# Patient Record
Sex: Female | Born: 1990 | Race: Black or African American | Hispanic: No | Marital: Single | Smoking: Current some day smoker
Health system: Southern US, Community
[De-identification: ages and names within clinical notes are randomized; demographics above are authoritative.]

## PROBLEM LIST (undated history)

## (undated) DIAGNOSIS — N926 Irregular menstruation, unspecified: Secondary | ICD-10-CM

## (undated) DIAGNOSIS — J45909 Unspecified asthma, uncomplicated: Secondary | ICD-10-CM

---

## 2013-06-01 ENCOUNTER — Emergency Department: Payer: Self-pay | Admitting: Emergency Medicine

## 2013-06-01 LAB — URINALYSIS, COMPLETE
Bacteria: NONE SEEN
Bilirubin,UR: NEGATIVE
Blood: NEGATIVE
Glucose,UR: NEGATIVE mg/dL (ref 0–75)
Ketone: NEGATIVE
Leukocyte Esterase: NEGATIVE
Nitrite: NEGATIVE
Protein: NEGATIVE
RBC,UR: 1 /HPF (ref 0–5)
Specific Gravity: 1.02 (ref 1.003–1.030)
Squamous Epithelial: 1
WBC UR: 5 /HPF (ref 0–5)

## 2013-06-01 LAB — WET PREP, GENITAL

## 2014-04-04 ENCOUNTER — Emergency Department: Payer: Self-pay | Admitting: Emergency Medicine

## 2014-05-06 ENCOUNTER — Emergency Department: Payer: Self-pay | Admitting: Emergency Medicine

## 2014-08-06 ENCOUNTER — Emergency Department: Payer: Self-pay | Admitting: Internal Medicine

## 2014-09-21 ENCOUNTER — Emergency Department: Payer: Self-pay | Admitting: Student

## 2015-04-22 ENCOUNTER — Encounter: Payer: Self-pay | Admitting: Emergency Medicine

## 2015-04-22 ENCOUNTER — Emergency Department
Admission: EM | Admit: 2015-04-22 | Discharge: 2015-04-22 | Disposition: A | Discharge status: Home / Self Care | Payer: Self-pay | Attending: Emergency Medicine | Admitting: Emergency Medicine

## 2015-04-22 ENCOUNTER — Emergency Department: Payer: Self-pay

## 2015-04-22 DIAGNOSIS — J209 Acute bronchitis, unspecified: Secondary | ICD-10-CM

## 2015-04-22 DIAGNOSIS — J45909 Unspecified asthma, uncomplicated: Secondary | ICD-10-CM | POA: Insufficient documentation

## 2015-04-22 DIAGNOSIS — Z720 Tobacco use: Secondary | ICD-10-CM | POA: Insufficient documentation

## 2015-04-22 DIAGNOSIS — J45901 Unspecified asthma with (acute) exacerbation: Secondary | ICD-10-CM | POA: Insufficient documentation

## 2015-04-22 DIAGNOSIS — R51 Headache: Secondary | ICD-10-CM | POA: Insufficient documentation

## 2015-04-22 MED ORDER — ALBUTEROL SULFATE HFA 108 (90 BASE) MCG/ACT IN AERS: 2.0000 | INHALATION_SPRAY | Freq: Four times a day (QID) | RESPIRATORY_TRACT | Status: DC | PRN

## 2015-04-22 MED ORDER — PREDNISONE 10 MG PO TABS: 50.0000 mg | ORAL_TABLET | Freq: Every day | ORAL | Status: DC

## 2015-04-22 MED ORDER — AZITHROMYCIN 250 MG PO TABS: ORAL_TABLET | ORAL | Status: DC

## 2015-04-22 NOTE — ED Notes (Signed)
Pt presents with multiple medical complaints of congestion, and chest pain intermittently. Pt states chest pain is worse when she is lifting stuff, but she has pain when she is just sitting. Pt states she has intermittent cough as well.

## 2015-04-22 NOTE — ED Notes (Signed)
Pt ambulatory to triage; states "I'm just feeling sick, I don't know if I have a sinus infection or bronchitis". Pt reports cough, chest congestion, headache, runny nose sinus pressure. Pt reports she is trying to quit smoking.

## 2015-04-22 NOTE — ED Provider Notes (Signed)
[facility] Emergency Department Provider Note  ____________________________________________  Time seen: Approximately 4:33 PM  I have reviewed the triage vital signs and the nursing notes.   HISTORY  Chief Complaint Cough    HPI Kathy Greene is a 24 y.o. female presents to the emergency department for a 7 day history of cough and congestion. She also reports that she has had some rhinorrhea, sore throat, bilateral ear pain. She tells me she has pain in her chest with coughing and taking a deep breath.She's had no relief with over-the-counter cold medications. She states that she switched her brand of cigarettes just prior to onset of symptoms.   Past Medical History  Diagnosis Date  . Arthritis     Patient Active Problem List   Diagnosis Date Noted  . Asthma 04/22/2015    History reviewed. No pertinent past surgical history.  Current Outpatient Rx  Name  Route  Sig  Dispense  Refill  . albuterol (PROVENTIL HFA;VENTOLIN HFA) 108 (90 BASE) MCG/ACT inhaler   Inhalation   Inhale 2 puffs into the lungs every 6 (six) hours as needed for wheezing or shortness of breath.   1 Inhaler   2   . azithromycin (ZITHROMAX Z-PAK) 250 MG tablet      Take 2 tablets (500 mg) on  Day 1,  followed by 1 tablet (250 mg) once daily on Days 2 through 5.   6 each   0   . predniSONE (DELTASONE) 10 MG tablet   Oral   Take 5 tablets (50 mg total) by mouth daily.   25 tablet   0     Allergies Sulfa antibiotics  No family history on file.  Social History History  Substance Use Topics  . Smoking status: Current Every Day Smoker  . Smokeless tobacco: Not on file  . Alcohol Use: No    Review of Systems Constitutional: Subjective fever 2 days ago. Eyes: No visual changes. ENT:  sore throat. Cardiovascular: Intermittent chest pain with cough and deep breath Respiratory: Denies shortness of breath. Gastrointestinal: No abdominal pain.  No nausea, no  vomiting.  No diarrhea.  No constipation. Genitourinary: Negative for dysuria. Musculoskeletal: Negative for back pain. Skin: Negative for rash. Neurological: Generalized headache without associated weakness.  10-point ROS otherwise negative.  ____________________________________________   PHYSICAL EXAM:  VITAL SIGNS: ED Triage Vitals  Enc Vitals Group     BP 04/22/15 1540 116/69 mmHg     Pulse Rate 04/22/15 1540 71     Resp 04/22/15 1540 19     Temp 04/22/15 1540 98.1 F (36.7 C)     Temp Source 04/22/15 1540 Oral     SpO2 04/22/15 1540 97 %     Weight 04/22/15 1540 200 lb (90.719 kg)     Height 04/22/15 1540 5\' 3"  (1.6 m)     Head Cir --      Peak Flow --      Pain Score 04/22/15 1540 0     Pain Loc --      Pain Edu? --      Excl. in GC? --     Constitutional: Alert and oriented. Well appearing and in no acute distress. Eyes: Conjunctivae are normal. PERRL. EOMI. Head: Atraumatic. Nose: No congestion/rhinnorhea. Mouth/Throat: Mucous membranes are moist.  Oropharynx mildly erythematous. Neck: No stridor.   Hematological/Lymphatic/Immunilogical: No cervical lymphadenopathy. Cardiovascular: Normal rate, regular rhythm. Grossly normal heart sounds.  Good peripheral circulation. Respiratory: Normal respiratory effort.  No retractions.  Faint expiratory wheeze in bilateral bases. Gastrointestinal: Soft and nontender. No distention. No abdominal bruits. No CVA tenderness. Musculoskeletal: No lower extremity tenderness nor edema.  No joint effusions. Neurologic:  Normal speech and language. No gross focal neurologic deficits are appreciated. Speech is normal. No gait instability. Skin:  Skin is warm, dry and intact. No rash noted. Psychiatric: Mood and affect are normal. Speech and behavior are normal.  ____________________________________________   LABS (all labs ordered are listed, but only abnormal results are displayed)  Labs Reviewed - No data to  display ____________________________________________  EKG  Not indicated ____________________________________________  RADIOLOGY  No infiltrate or acute illness. ____________________________________________   PROCEDURES  Procedure(s) performed: None  Critical Care performed: No  ____________________________________________   INITIAL IMPRESSION / ASSESSMENT AND PLAN / ED COURSE  Pertinent labs & imaging results that were available during my care of the patient were reviewed by me and considered in my medical decision making (see chart for details).  Initial impression: Upper respiratory tract infection. Due to subjective fever and length of cough with intermittent chest pain I will do a chest x-ray. ____________________________________________   FINAL CLINICAL IMPRESSION(S) / ED DIAGNOSES  Final diagnoses:  Acute bronchitis, unspecified organism      Chinita Pester, FNP 04/22/15 1746  Minna Antis, MD 04/22/15 2353

## 2015-04-22 NOTE — Discharge Instructions (Signed)

## 2015-06-23 ENCOUNTER — Emergency Department: Payer: BLUE CROSS/BLUE SHIELD

## 2015-06-23 ENCOUNTER — Emergency Department
Admission: EM | Admit: 2015-06-23 | Discharge: 2015-06-23 | Disposition: A | Discharge status: Home / Self Care | Payer: BLUE CROSS/BLUE SHIELD | Attending: Emergency Medicine | Admitting: Emergency Medicine

## 2015-06-23 ENCOUNTER — Encounter: Payer: Self-pay | Admitting: Emergency Medicine

## 2015-06-23 DIAGNOSIS — Z7952 Long term (current) use of systemic steroids: Secondary | ICD-10-CM | POA: Diagnosis not present

## 2015-06-23 DIAGNOSIS — Z720 Tobacco use: Secondary | ICD-10-CM | POA: Diagnosis not present

## 2015-06-23 DIAGNOSIS — J45909 Unspecified asthma, uncomplicated: Secondary | ICD-10-CM

## 2015-06-23 DIAGNOSIS — J069 Acute upper respiratory infection, unspecified: Secondary | ICD-10-CM | POA: Diagnosis not present

## 2015-06-23 DIAGNOSIS — Z79899 Other long term (current) drug therapy: Secondary | ICD-10-CM | POA: Diagnosis not present

## 2015-06-23 DIAGNOSIS — F17200 Nicotine dependence, unspecified, uncomplicated: Secondary | ICD-10-CM

## 2015-06-23 DIAGNOSIS — R05 Cough: Secondary | ICD-10-CM | POA: Diagnosis present

## 2015-06-23 HISTORY — DX: Unspecified asthma, uncomplicated: J45.909

## 2015-06-23 MED ORDER — BENZONATATE 100 MG PO CAPS: ORAL_CAPSULE | ORAL | Status: DC

## 2015-06-23 NOTE — Discharge Instructions (Signed)
FOLLOW UP WITH [facility] IF ANY CONTINUED PROBLEMS  STOP SMOKING

## 2015-06-23 NOTE — ED Notes (Signed)
States finished antibiotic for same a few days ago.

## 2015-06-23 NOTE — ED Notes (Signed)
AAOx3.  No SOB/DOE.  Reports history of Asthma.  Has used Albuterol inhaler infrequently, but states after using it, patient feels better.

## 2015-06-23 NOTE — ED Provider Notes (Signed)
[facility] Emergency Department Provider Note  ____________________________________________  Time seen: Approximately 1:56 PM  I have reviewed the triage vital signs and the nursing notes.   HISTORY  Chief Complaint Cough   HPI Kathy Greene is a 24 y.o. female patient is here with complaint of cough. She states that she was seen at [facility] last week where she was given an albuterol inhaler. Patient does have a history of asthma and uses an inhaler infrequently. She is continue to smoke 6 cigarettes per day. She denies any fever, headache or nausea or vomiting. Cough is mostly nonproductive. Pain scale at this time is 0/0   Past Medical History  Diagnosis Date  . Asthma     Patient Active Problem List   Diagnosis Date Noted  . Asthma 04/22/2015    History reviewed. No pertinent past surgical history.  Current Outpatient Rx  Name  Route  Sig  Dispense  Refill  . albuterol (PROVENTIL HFA;VENTOLIN HFA) 108 (90 BASE) MCG/ACT inhaler   Inhalation   Inhale 2 puffs into the lungs every 6 (six) hours as needed for wheezing or shortness of breath.   1 Inhaler   2   . azithromycin (ZITHROMAX Z-PAK) 250 MG tablet      Take 2 tablets (500 mg) on  Day 1,  followed by 1 tablet (250 mg) once daily on Days 2 through 5.   6 each   0   . benzonatate (TESSALON PERLES) 100 MG capsule      1-2 perles every 8 hours prn cough   30 capsule   0   . predniSONE (DELTASONE) 10 MG tablet   Oral   Take 5 tablets (50 mg total) by mouth daily.   25 tablet   0     Allergies Sulfa antibiotics  History reviewed. No pertinent family history.  Social History History  Substance Use Topics  . Smoking status: Current Every Day Smoker    Types: Cigarettes  . Smokeless tobacco: Not on file  . Alcohol Use: No    Review of Systems Constitutional: No fever/chills Eyes: No visual changes. ENT: No sore throat. Cardiovascular: Denies chest  pain. Respiratory: Denies shortness of breath. Productive cough Gastrointestinal: No abdominal pain.  No nausea, no vomiting.  No diarrhea.  No constipation. Genitourinary: Negative for dysuria. Musculoskeletal: Negative for back pain. Skin: Negative for rash. Neurological: Negative for headaches, focal weakness or numbness.  10-point ROS otherwise negative.  ____________________________________________   PHYSICAL EXAM:  VITAL SIGNS: ED Triage Vitals  Enc Vitals Group     BP 06/23/15 1201 120/68 mmHg     Pulse Rate 06/23/15 1201 83     Resp --      Temp 06/23/15 1201 98.2 F (36.8 C)     Temp src --      SpO2 06/23/15 1201 97 %     Weight 06/23/15 1201 207 lb (93.895 kg)     Height 06/23/15 1201  (1.6 m)     Head Cir --      Peak Flow --      Pain Score 06/23/15 1202 0     Pain Loc --      Pain Edu? --      Excl. in GC? --     Constitutional: Alert and oriented. Well appearing and in no acute distress. Eyes: Conjunctivae are normal. PERRL. EOMI. Head: Atraumatic. Nose: No congestion/rhinnorhea. Neck: No stridor.   Cardiovascular: Normal rate, regular rhythm. Grossly  normal heart sounds.  Good peripheral circulation. Respiratory: Normal respiratory effort.  No retractions. Lungs CTAB. Gastrointestinal: Soft and nontender. No distention.  Musculoskeletal: No lower extremity tenderness nor edema.  No joint effusions. Neurologic:  Normal speech and language. No gross focal neurologic deficits are appreciated. No gait instability. Skin:  Skin is warm, dry and intact. No rash noted. Psychiatric: Mood and affect are normal. Speech and behavior are normal.  ____________________________________________   LABS (all labs ordered are listed, but only abnormal results are displayed)  Labs Reviewed - No data to display ____________________________________________  RADIOLOGY  Chest x-ray per radiologist and reviewed by me showed no active cardiopulmonary disease I,  Tommi Rumps, personally viewed and evaluated these images as part of my medical decision making.  ____________________________________________   PROCEDURES  Procedure(s) performed: None  Critical Care performed: No  ____________________________________________   INITIAL IMPRESSION / ASSESSMENT AND PLAN / ED COURSE  Pertinent labs & imaging results that were available during my care of the patient were reviewed by me and considered in my medical decision making (see chart for details).  Patient was reassured that the Augmentin that she took from [facility] is sufficient she was started on Tessalon Perles for cough. She is encouraged to stop smoking and she will continue using albuterol inhaler as needed. ____________________________________________   FINAL CLINICAL IMPRESSION(S) / ED DIAGNOSES  Final diagnoses:  URI (upper respiratory infection)  Asthma in adult, unspecified asthma severity, uncomplicated  Current smoker      Tommi Rumps, PA-C 06/23/15 1544  Arnaldo Natal, MD 06/23/15 1637  Arnaldo Natal, MD 06/23/15 [phone]

## 2015-06-23 NOTE — ED Notes (Signed)
Pt alert and oriented X4, active, cooperative, pt in NAD. RR even and unlabored, color WNL.  Pt informed to return if any life threatening symptoms occur.   

## 2015-07-17 ENCOUNTER — Encounter: Payer: Self-pay | Admitting: Emergency Medicine

## 2015-07-17 ENCOUNTER — Emergency Department
Admission: EM | Admit: 2015-07-17 | Discharge: 2015-07-17 | Disposition: A | Discharge status: Home / Self Care | Payer: BLUE CROSS/BLUE SHIELD | Attending: Emergency Medicine | Admitting: Emergency Medicine

## 2015-07-17 DIAGNOSIS — Z720 Tobacco use: Secondary | ICD-10-CM | POA: Insufficient documentation

## 2015-07-17 DIAGNOSIS — Z7952 Long term (current) use of systemic steroids: Secondary | ICD-10-CM | POA: Insufficient documentation

## 2015-07-17 DIAGNOSIS — N939 Abnormal uterine and vaginal bleeding, unspecified: Secondary | ICD-10-CM | POA: Diagnosis present

## 2015-07-17 DIAGNOSIS — Z3202 Encounter for pregnancy test, result negative: Secondary | ICD-10-CM | POA: Insufficient documentation

## 2015-07-17 DIAGNOSIS — N938 Other specified abnormal uterine and vaginal bleeding: Secondary | ICD-10-CM | POA: Diagnosis not present

## 2015-07-17 LAB — COMPREHENSIVE METABOLIC PANEL
ALT: 16 U/L (ref 14–54)
ANION GAP: 7 (ref 5–15)
AST: 22 U/L (ref 15–41)
Albumin: 3.6 g/dL (ref 3.5–5.0)
Alkaline Phosphatase: 52 U/L (ref 38–126)
BUN: 8 mg/dL (ref 6–20)
CHLORIDE: 109 mmol/L (ref 101–111)
CO2: 26 mmol/L (ref 22–32)
Calcium: 9.5 mg/dL (ref 8.9–10.3)
Creatinine, Ser: 0.91 mg/dL (ref 0.44–1.00)
GFR calc non Af Amer: >60 mL/min (ref >60)
Glucose, Bld: 95 mg/dL (ref 65–99)
Potassium: 4.2 mmol/L (ref 3.5–5.1)
SODIUM: 142 mmol/L (ref 135–145)
Total Bilirubin: 0.3 mg/dL (ref 0.3–1.2)
Total Protein: 7.1 g/dL (ref 6.5–8.1)

## 2015-07-17 LAB — CBC
HCT: 36.8 % (ref 35.0–47.0)
Hemoglobin: 11.5 g/dL — ABNORMAL LOW (ref 12.0–16.0)
MCH: 26.7 pg (ref 26.0–34.0)
MCHC: 31.1 g/dL — AB (ref 32.0–36.0)
MCV: 85.8 fL (ref 80.0–100.0)
PLATELETS: 257 10*3/uL (ref 150–440)
RBC: 4.29 MIL/uL (ref 3.80–5.20)
RDW: 16.1 % — ABNORMAL HIGH (ref 11.5–14.5)
WBC: 5.2 10*3/uL (ref 3.6–11.0)

## 2015-07-17 LAB — URINALYSIS COMPLETE WITH MICROSCOPIC (ARMC ONLY)
BACTERIA UA: NONE SEEN
Bilirubin Urine: NEGATIVE
Glucose, UA: NEGATIVE mg/dL
Ketones, ur: NEGATIVE mg/dL
LEUKOCYTES UA: NEGATIVE
NITRITE: NEGATIVE
PROTEIN: NEGATIVE mg/dL
SPECIFIC GRAVITY, URINE: 1.015 (ref 1.005–1.030)
pH: 5 (ref 5.0–8.0)

## 2015-07-17 LAB — WET PREP, GENITAL
CLUE CELLS WET PREP: NONE SEEN
TRICH WET PREP: NONE SEEN
WBC, Wet Prep HPF POC: NONE SEEN
Yeast Wet Prep HPF POC: NONE SEEN

## 2015-07-17 LAB — CHLAMYDIA/NGC RT PCR (ARMC ONLY)
CHLAMYDIA TR: NOT DETECTED
N gonorrhoeae: NOT DETECTED

## 2015-07-17 LAB — PREGNANCY, URINE: PREG TEST UR: NEGATIVE

## 2015-07-17 MED ORDER — TRAMADOL HCL 50 MG PO TABS: 50.0000 mg | ORAL_TABLET | Freq: Four times a day (QID) | ORAL | Status: DC | PRN

## 2015-07-17 NOTE — ED Notes (Signed)
Pt to ed with c/o vaginal bleeding that started on Sat.  Pt states she has not had a period in several months due to depo and then started on BCPs.  Pt states period is heavy with cramping and nausea.

## 2015-07-17 NOTE — Discharge Instructions (Signed)

## 2015-07-17 NOTE — ED Provider Notes (Signed)
[facility] Emergency Department Provider Note  Time seen: 2:54 PM  I have reviewed the triage vital signs and the nursing notes.   HISTORY  Chief Complaint Vaginal Bleeding    HPI Kathy Greene is a 24 y.o. female with a past medical history of asthma who presents the emergency department 2 days of lower abdominal cramping and vaginal bleeding. According to the patient she has had very irregular periods. She was on Depo-Provera until January. Now she states she is on birth control pills. States she has not had regular periods for greater than 1 year. She presents the emergency department today for heavy vaginal bleeding 2 days, with intermittent abdominal cramping. Patient states the cramping is somewhat worse than her normal cramping with her periods, but states she has not had a period for quite a while. Describes the cramping as moderate, lower abdomen, no modifying factors identified. Some nausea but denies vomiting, diarrhea, dysuria.     Past Medical History  Diagnosis Date  . Asthma     Patient Active Problem List   Diagnosis Date Noted  . Asthma 04/22/2015    History reviewed. No pertinent past surgical history.  Current Outpatient Rx  Name  Route  Sig  Dispense  Refill  . albuterol (PROVENTIL HFA;VENTOLIN HFA) 108 (90 BASE) MCG/ACT inhaler   Inhalation   Inhale 2 puffs into the lungs every 6 (six) hours as needed for wheezing or shortness of breath.   1 Inhaler   2   . azithromycin (ZITHROMAX Z-PAK) 250 MG tablet      Take 2 tablets (500 mg) on  Day 1,  followed by 1 tablet (250 mg) once daily on Days 2 through 5.   6 each   0   . benzonatate (TESSALON PERLES) 100 MG capsule      1-2 perles every 8 hours prn cough   30 capsule   0   . predniSONE (DELTASONE) 10 MG tablet   Oral   Take 5 tablets (50 mg total) by mouth daily.   25 tablet   0     Allergies Sulfa antibiotics  History reviewed. No pertinent family  history.  Social History Social History  Substance Use Topics  . Smoking status: Current Every Day Smoker    Types: Cigarettes  . Smokeless tobacco: None  . Alcohol Use: No    Review of Systems Constitutional: Negative for fever. Cardiovascular: Negative for chest pain. Respiratory: Negative for shortness of breath. Gastrointestinal: Positive for lower abdominal cramping. Genitourinary: Negative for dysuria. Positive for vaginal bleeding. Musculoskeletal: Negative for back pain. Neurological: Negative for headache 10-point ROS otherwise negative.  ____________________________________________   PHYSICAL EXAM:  VITAL SIGNS: ED Triage Vitals  Enc Vitals Group     BP 07/17/15 1322 113/65 mmHg     Pulse Rate 07/17/15 1322 83     Resp 07/17/15 1322 20     Temp 07/17/15 1322 98.5 F (36.9 C)     Temp Source 07/17/15 1322 Oral     SpO2 07/17/15 1322 97 %     Weight 07/17/15 1322 200 lb (90.719 kg)     Height 07/17/15 1322 5\' 3"  (1.6 m)     Head Cir --      Peak Flow --      Pain Score 07/17/15 1323 10     Pain Loc --      Pain Edu? --      Excl. in GC? --  Constitutional: Alert and oriented. Well appearing and in no distress. Eyes: Normal exam ENT   Mouth/Throat: Mucous membranes are moist. Cardiovascular: Normal rate, regular rhythm. No murmurs, rubs, or gallops. Respiratory: Normal respiratory effort without tachypnea nor retractions. Breath sounds are clear and equal bilaterally. No wheezes/rales/rhonchi. Gastrointestinal: Soft, mild suprapubic tenderness palpation. No rebound or guarding. No CVA tenderness palpation. Genitourinary: Mild bleeding from the cervical os on exam. No cervical motion tenderness or adnexal tenderness on exam. Musculoskeletal: Nontender with normal range of motion in all extremities.  Neurologic:  Normal speech and language. No gross focal neurologic deficits  Skin:  Skin is warm, dry and intact.  Psychiatric: Mood and affect are  normal. Speech and behavior are normal.   ____________________________________________    INITIAL IMPRESSION / ASSESSMENT AND PLAN / ED COURSE  Pertinent labs & imaging results that were available during my care of the patient were reviewed by me and considered in my medical decision making (see chart for details).  Patient with 2 days of vaginal bleeding and lower abdominal cramping. We'll check labs, and perform a pelvic exam. Patient appears very well, no acute distress. Mild lower abdominal tenderness palpation on exam.  Labs are within normal limits. Mild vaginal bleeding on exam. We'll discharge patient home with OB/GYN follow-up.  ____________________________________________   FINAL CLINICAL IMPRESSION(S) / ED DIAGNOSES  Abdominal cramping Vaginal bleeding   Minna Antis, MD 07/17/15 [phone]

## 2015-12-26 ENCOUNTER — Emergency Department
Admission: EM | Admit: 2015-12-26 | Discharge: 2015-12-26 | Disposition: A | Discharge status: Home / Self Care | Payer: BLUE CROSS/BLUE SHIELD | Attending: Emergency Medicine | Admitting: Emergency Medicine

## 2015-12-26 ENCOUNTER — Encounter: Payer: Self-pay | Admitting: *Deleted

## 2015-12-26 ENCOUNTER — Emergency Department: Payer: BLUE CROSS/BLUE SHIELD

## 2015-12-26 DIAGNOSIS — N938 Other specified abnormal uterine and vaginal bleeding: Secondary | ICD-10-CM | POA: Insufficient documentation

## 2015-12-26 DIAGNOSIS — Z7952 Long term (current) use of systemic steroids: Secondary | ICD-10-CM | POA: Diagnosis not present

## 2015-12-26 DIAGNOSIS — Z79899 Other long term (current) drug therapy: Secondary | ICD-10-CM | POA: Diagnosis not present

## 2015-12-26 DIAGNOSIS — N920 Excessive and frequent menstruation with regular cycle: Secondary | ICD-10-CM

## 2015-12-26 DIAGNOSIS — F17210 Nicotine dependence, cigarettes, uncomplicated: Secondary | ICD-10-CM | POA: Insufficient documentation

## 2015-12-26 DIAGNOSIS — N939 Abnormal uterine and vaginal bleeding, unspecified: Secondary | ICD-10-CM | POA: Diagnosis present

## 2015-12-26 LAB — URINALYSIS COMPLETE WITH MICROSCOPIC (ARMC ONLY)
BILIRUBIN URINE: NEGATIVE
Bacteria, UA: NONE SEEN
GLUCOSE, UA: NEGATIVE mg/dL
KETONES UR: NEGATIVE mg/dL
LEUKOCYTES UA: NEGATIVE
NITRITE: NEGATIVE
PH: 7 (ref 5.0–8.0)
Protein, ur: 100 mg/dL — AB
Specific Gravity, Urine: 1.027 (ref 1.005–1.030)

## 2015-12-26 LAB — CBC WITH DIFFERENTIAL/PLATELET
BASOS ABS: 0.1 10*3/uL (ref 0–0.1)
BASOS PCT: 1 %
EOS ABS: 0.1 10*3/uL (ref 0–0.7)
EOS PCT: 2 %
HCT: 35 % (ref 35.0–47.0)
Hemoglobin: 11.5 g/dL — ABNORMAL LOW (ref 12.0–16.0)
LYMPHS PCT: 43 %
Lymphs Abs: 2.5 10*3/uL (ref 1.0–3.6)
MCH: 29.2 pg (ref 26.0–34.0)
MCHC: 32.7 g/dL (ref 32.0–36.0)
MCV: 89.2 fL (ref 80.0–100.0)
Monocytes Absolute: 0.4 10*3/uL (ref 0.2–0.9)
Monocytes Relative: 7 %
Neutro Abs: 2.7 10*3/uL (ref 1.4–6.5)
Neutrophils Relative %: 47 %
PLATELETS: 262 10*3/uL (ref 150–440)
RBC: 3.93 MIL/uL (ref 3.80–5.20)
RDW: 15.7 % — ABNORMAL HIGH (ref 11.5–14.5)
WBC: 5.7 10*3/uL (ref 3.6–11.0)

## 2015-12-26 LAB — BASIC METABOLIC PANEL
Anion gap: 8 (ref 5–15)
BUN: 12 mg/dL (ref 6–20)
CALCIUM: 8.9 mg/dL (ref 8.9–10.3)
CO2: 25 mmol/L (ref 22–32)
CREATININE: 0.73 mg/dL (ref 0.44–1.00)
Chloride: 107 mmol/L (ref 101–111)
GFR calc Af Amer: >60 mL/min (ref >60)
Glucose, Bld: 108 mg/dL — ABNORMAL HIGH (ref 65–99)
POTASSIUM: 3.8 mmol/L (ref 3.5–5.1)
SODIUM: 140 mmol/L (ref 135–145)

## 2015-12-26 NOTE — ED Notes (Signed)
Pt reports she went off of depo shot 1 year ago, regularity to periods began 4 months ago, lasting anywhere from 3-5 days. Pt states she began to bleed on January 7 and continues today. Pt reports vaginal camping. Pt also reports [facility] for STD testing, negative on all.

## 2015-12-26 NOTE — ED Notes (Signed)
Pt reports she has been bleeding for 3 weeks.  Tonight abd cramping became worse.  No dysuria.  Pt has lower back pain.

## 2015-12-26 NOTE — Discharge Instructions (Signed)

## 2015-12-26 NOTE — ED Provider Notes (Signed)
[facility] Emergency Department Provider Note  ____________________________________________  Time seen: 3:50 AM  I have reviewed the triage vital signs and the nursing notes.   HISTORY  Chief Complaint Abdominal Cramping      HPI Kathy Greene is a 25 y.o. female presents with vaginal bleeding 3 weeks. Patient states that she is using approximately 4-5 pads per day for the past 3 weeks. Patient admits to very irregular menses since discontinuing the pole one year ago. In addition the patient states that [facility] ago were STD testings were done which were all negative.     Past Medical History  Diagnosis Date  . Asthma     Patient Active Problem List   Diagnosis Date Noted  . Asthma 04/22/2015    No past surgical history on file.  Current Outpatient Rx  Name  Route  Sig  Dispense  Refill  . albuterol (PROVENTIL HFA;VENTOLIN HFA) 108 (90 BASE) MCG/ACT inhaler   Inhalation   Inhale 2 puffs into the lungs every 6 (six) hours as needed for wheezing or shortness of breath.   1 Inhaler   2   . azithromycin (ZITHROMAX Z-PAK) 250 MG tablet      Take 2 tablets (500 mg) on  Day 1,  followed by 1 tablet (250 mg) once daily on Days 2 through 5.   6 each   0   . benzonatate (TESSALON PERLES) 100 MG capsule      1-2 perles every 8 hours prn cough   30 capsule   0   . predniSONE (DELTASONE) 10 MG tablet   Oral   Take 5 tablets (50 mg total) by mouth daily.   25 tablet   0   . traMADol (ULTRAM) 50 MG tablet   Oral   Take 1 tablet (50 mg total) by mouth every 6 (six) hours as needed.   20 tablet   0     Allergies Sulfa antibiotics  No family history on file.  Social History Social History  Substance Use Topics  . Smoking status: Current Every Day Smoker    Types: Cigarettes  . Smokeless tobacco: None  . Alcohol Use: No    Review of Systems  Constitutional: Negative for  fever. Eyes: Negative for visual changes. ENT: Negative for sore throat. Cardiovascular: Negative for chest pain. Respiratory: Negative for shortness of breath. Gastrointestinal: Negative for abdominal pain, vomiting and diarrhea. Genitourinary: Negative for dysuria. Positive for vaginal bleeding Musculoskeletal: Negative for back pain. Skin: Negative for rash. Neurological: Negative for headaches, focal weakness or numbness.   10-point ROS otherwise negative.  ____________________________________________   PHYSICAL EXAM:  VITAL SIGNS: ED Triage Vitals  Enc Vitals Group     BP 12/26/15 0141 111/52 mmHg     Pulse Rate 12/26/15 0141 62     Resp 12/26/15 0141 18     Temp 12/26/15 0141 97.5 F (36.4 C)     Temp Source 12/26/15 0141 Oral     SpO2 12/26/15 0141 97 %     Weight 12/26/15 0141 225 lb (102.059 kg)     Height 12/26/15 0141  (1.6 m)     Head Cir --      Peak Flow --      Pain Score 12/26/15 0147 10     Pain Loc --      Pain Edu? --      Excl. in GC? --  Constitutional: Alert and oriented. Well appearing and in no distress. Eyes: Conjunctivae are normal. PERRL. Normal extraocular movements. ENT   Head: Normocephalic and atraumatic.   Nose: No congestion/rhinnorhea.   Mouth/Throat: Mucous membranes are moist.   Neck: No stridor. Hematological/Lymphatic/Immunilogical: No cervical lymphadenopathy. Cardiovascular: Normal rate, regular rhythm. Normal and symmetric distal pulses are present in all extremities. No murmurs, rubs, or gallops. Respiratory: Normal respiratory effort without tachypnea nor retractions. Breath sounds are clear and equal bilaterally. No wheezes/rales/rhonchi. Gastrointestinal: Soft and nontender. No distention. There is no CVA tenderness. Genitourinary: deferred Musculoskeletal: Nontender with normal range of motion in all extremities. No joint effusions.  No lower extremity tenderness nor edema. Neurologic:  Normal  speech and language. No gross focal neurologic deficits are appreciated. Speech is normal.  Skin:  Skin is warm, dry and intact. No rash noted. Psychiatric: Mood and affect are normal. Speech and behavior are normal. Patient exhibits appropriate insight and judgment.  ____________________________________________    LABS (pertinent positives/negatives)  Labs Reviewed  BASIC METABOLIC PANEL - Abnormal; Notable for the following:    Glucose, Bld 108 (*)    All other components within normal limits  CBC WITH DIFFERENTIAL/PLATELET - Abnormal; Notable for the following:    Hemoglobin 11.5 (*)    RDW 15.7 (*)    All other components within normal limits  URINALYSIS COMPLETEWITH MICROSCOPIC ([facility] ONLY) - Abnormal; Notable for the following:    Color, Urine YELLOW (*)    APPearance CLOUDY (*)    Hgb urine dipstick 3+ (*)    Protein, ur 100 (*)    Squamous Epithelial / LPF 0-5 (*)    All other components within normal limits       RADIOLOGY     US Pelvis Complete (Final result) Result time: 12/26/15 05:47:00   Final result by Rad Results In Interface (12/26/15 05:47:00)   Narrative:   CLINICAL DATA: Subacute onset of menorrhagia. Initial encounter.  EXAM: TRANSABDOMINAL AND TRANSVAGINAL ULTRASOUND OF PELVIS  TECHNIQUE: Both transabdominal and transvaginal ultrasound examinations of the pelvis were performed. Transabdominal technique was performed for global imaging of the pelvis including uterus, ovaries, adnexal regions, and pelvic cul-de-sac. It was necessary to proceed with endovaginal exam following the transabdominal exam to visualize the uterus and ovaries in greater detail.  COMPARISON: None  FINDINGS: Uterus  Measurements: 6.8 x 4.2 x 5.2 cm. No fibroids or other mass visualized. The uterus is retroverted in nature.  Endometrium  Thickness: 1.2 cm. A small amount of complex fluid is noted within the endometrial echo complex.  Right  ovary  Measurements: 4.2 x 3.3 x 3.0 cm. A 3.0 cm simple cyst is noted at the right ovary.  Left ovary  Measurements: 3.5 x 1.8 x 2.0 cm. Normal appearance/no adnexal mass.  Other findings  Trace free fluid is noted at the right adnexa.  IMPRESSION: 1. Small amount of complex fluid within the endometrial echo complex, reflecting blood. This likely corresponds to the patient's menorrhagia. 2. Small right ovarian cyst is likely physiologic.   Electronically Signed By: Roanna Raider M.D. On: 12/26/2015 05:47          US Transvaginal Non-OB (Final result) Result time: 12/26/15 05:47:00   Final result by Rad Results In Interface (12/26/15 05:47:00)   Narrative:   CLINICAL DATA: Subacute onset of menorrhagia. Initial encounter.  EXAM: TRANSABDOMINAL AND TRANSVAGINAL ULTRASOUND OF PELVIS  TECHNIQUE: Both transabdominal and transvaginal ultrasound examinations of the pelvis were performed. Transabdominal technique was performed for global imaging of  the pelvis including uterus, ovaries, adnexal regions, and pelvic cul-de-sac. It was necessary to proceed with endovaginal exam following the transabdominal exam to visualize the uterus and ovaries in greater detail.  COMPARISON: None  FINDINGS: Uterus  Measurements: 6.8 x 4.2 x 5.2 cm. No fibroids or other mass visualized. The uterus is retroverted in nature.  Endometrium  Thickness: 1.2 cm. A small amount of complex fluid is noted within the endometrial echo complex.  Right ovary  Measurements: 4.2 x 3.3 x 3.0 cm. A 3.0 cm simple cyst is noted at the right ovary.  Left ovary  Measurements: 3.5 x 1.8 x 2.0 cm. Normal appearance/no adnexal mass.  Other findings  Trace free fluid is noted at the right adnexa.  IMPRESSION: 1. Small amount of complex fluid within the endometrial echo complex, reflecting blood. This likely corresponds to the patient's menorrhagia. 2. Small right ovarian cyst is  likely physiologic.   Electronically Signed By: Roanna Raider M.D. On: 12/26/2015 05:47           INITIAL IMPRESSION / ASSESSMENT AND PLAN / ED COURSE  Pertinent labs & imaging results that were available during my care of the patient were reviewed by me and considered in my medical decision making (see chart for details).  History of physical exam consistent with dysfunctional uterine bleeding. Patient being referred to Dr. Dalbert [facility] on call for further evaluation and management on the outpatient setting  ____________________________________________   FINAL CLINICAL IMPRESSION(S) / ED DIAGNOSES  Final diagnoses:  Dysfunctional uterine bleeding      Darci Current, MD 12/27/15 [phone]

## 2016-03-06 ENCOUNTER — Emergency Department
Admission: EM | Admit: 2016-03-06 | Discharge: 2016-03-06 | Disposition: A | Discharge status: Home / Self Care | Payer: BLUE CROSS/BLUE SHIELD | Attending: Emergency Medicine | Admitting: Emergency Medicine

## 2016-03-06 ENCOUNTER — Encounter: Payer: Self-pay | Admitting: Emergency Medicine

## 2016-03-06 DIAGNOSIS — Z791 Long term (current) use of non-steroidal anti-inflammatories (NSAID): Secondary | ICD-10-CM | POA: Diagnosis not present

## 2016-03-06 DIAGNOSIS — N921 Excessive and frequent menstruation with irregular cycle: Secondary | ICD-10-CM | POA: Insufficient documentation

## 2016-03-06 DIAGNOSIS — J45909 Unspecified asthma, uncomplicated: Secondary | ICD-10-CM | POA: Diagnosis not present

## 2016-03-06 DIAGNOSIS — N938 Other specified abnormal uterine and vaginal bleeding: Secondary | ICD-10-CM | POA: Diagnosis not present

## 2016-03-06 DIAGNOSIS — Z79899 Other long term (current) drug therapy: Secondary | ICD-10-CM | POA: Insufficient documentation

## 2016-03-06 DIAGNOSIS — R109 Unspecified abdominal pain: Secondary | ICD-10-CM | POA: Diagnosis present

## 2016-03-06 DIAGNOSIS — F17210 Nicotine dependence, cigarettes, uncomplicated: Secondary | ICD-10-CM | POA: Insufficient documentation

## 2016-03-06 MED ORDER — DICYCLOMINE HCL 20 MG PO TABS: 20.0000 mg | ORAL_TABLET | Freq: Three times a day (TID) | ORAL | Status: DC | PRN

## 2016-03-06 MED ORDER — MEDROXYPROGESTERONE ACETATE 10 MG PO TABS: 10.0000 mg | ORAL_TABLET | Freq: Every day | ORAL | Status: DC

## 2016-03-06 NOTE — Discharge Instructions (Signed)
Dysfunctional Uterine Bleeding Dysfunctional uterine bleeding is abnormal bleeding from the uterus. Dysfunctional uterine bleeding includes:  A period that comes earlier or later than usual.  A period that is lighter, heavier, or has blood clots.  Bleeding between periods.  Skipping one or more periods.  Bleeding after sexual intercourse.  Bleeding after menopause. HOME CARE INSTRUCTIONS  Pay attention to any changes in your symptoms. Follow these instructions to help with your condition: Eating  Eat well-balanced meals. Include foods that are high in iron, such as liver, meat, shellfish, green leafy vegetables, and eggs.  If you become constipated:  Drink plenty of water.  Eat fruits and vegetables that are high in water and fiber, such as spinach, carrots, raspberries, apples, and mango. Medicines  Take over-the-counter and prescription medicines only as told by your health care provider.  Do not change medicines without talking with your health care provider.  Aspirin or medicines that contain aspirin may make the bleeding worse. Do not take those medicines:  During the week before your period.  During your period.  If you were prescribed iron pills, take them as told by your health care provider. Iron pills help to replace iron that your body loses because of this condition. Activity  If you need to change your sanitary pad or tampon more than one time every 2 hours:  Lie in bed with your feet raised (elevated).  Place a cold pack on your lower abdomen.  Rest as much as possible until the bleeding stops or slows down.  Do not try to lose weight until the bleeding has stopped and your blood iron level is back to normal. Other Instructions  For two months, write down:  When your period starts.  When your period ends.  When any abnormal bleeding occurs.  What problems you notice.  Keep all follow up visits as told by your health care provider. This is  important. SEEK MEDICAL CARE IF:  You get light-headed or weak.  You have nausea and vomiting.  You cannot eat or drink without vomiting.  You feel dizzy or have diarrhea while you are taking medicines.  You are taking birth control pills or hormones, and you want to change them or stop taking them. SEEK IMMEDIATE MEDICAL CARE IF:  You develop a fever or chills.  You need to change your sanitary pad or tampon more than one time per hour.  Your bleeding becomes heavier, or your flow contains clots more often.  You develop pain in your abdomen.  You lose consciousness.  You develop a rash.   This information is not intended to replace advice given to you by your health care provider. Make sure you discuss any questions you have with your health care provider.   Document Released: 11/14/2000 Document Revised: 08/08/2015 Document Reviewed: 02/12/2015 Elsevier Interactive Patient Education 2016 [employer]  Metrorrhagia Metrorrhagia is bleeding from the uterus that happens irregularly but often. The bleeding generally happens between menstrual periods. HOME CARE INSTRUCTIONS Pay attention to any changes in your symptoms. Follow these instructions to help with your condition: Eating  Eat well-balanced meals. Include foods that are high in iron, such as liver, meat, shellfish, green leafy vegetables, and eggs.  If you become constipated:  Drink plenty of water.  Eat fruits and vegetables that are high in water and fiber, such as spinach, carrots, raspberries, apples, and mango. Medicines  Take over-the-counter and prescription medicines only as told by your health care provider.  Do not change  medicines without talking with your health care provider.  Aspirin or medicines that contain aspirin may make the bleeding worse. Do not take those medicines:  During the week before your period.  During your period.  If you were prescribed iron pills, take them as told by  your health care provider. Iron pills help to replace iron that your body loses because of this condition. Activity  If you need to change your sanitary pad or tampon more than one time every 2 hours:  Lie in bed with your feet raised (elevated).  Place a cold pack on your lower abdomen.  Rest as much as possible until the bleeding stops or slows down.  Do not try to lose weight until the bleeding has stopped and your blood iron level is back to normal. Other Instructions  For two months, write down:  When your period starts.  When your period ends.  When any abnormal bleeding occurs.  What problems you notice.  Keep all follow-up visits as told by your health care provider. This is important. SEEK MEDICAL CARE IF:  You get light-headed or weak.  You have nausea and vomiting.  You cannot eat or drink without vomiting.  You feel dizzy or have diarrhea while you are taking medicine.  You are taking birth control pills or hormones, and you want to change them or stop taking them. SEEK IMMEDIATE MEDICAL CARE IF:  You develop a fever or chills.  You need to change your sanitary pad or tampon more than one time per hour.  Your bleeding becomesheavy.  Your flow contains clots.  You develop pain in your abdomen.  You lose consciousness.  You develop a rash.   This information is not intended to replace advice given to you by your health care provider. Make sure you discuss any questions you have with your health care provider.   Document Released: 11/17/2005 Document Revised: 08/08/2015 Document Reviewed: 02/12/2015 Elsevier Interactive Patient Education [employer]

## 2016-03-06 NOTE — ED Notes (Signed)
Pt presents with abd cramping and bleeding for several mths.

## 2016-03-06 NOTE — ED Provider Notes (Signed)
[facility] Emergency Department Provider Note  ____________________________________________  Time seen: 12:50 PM  I have reviewed the triage vital signs and the nursing notes.   HISTORY  Chief Complaint Abdominal Cramping    HPI Kathy Greene is a 25 y.o. female who complains of chronic pelvic cramping and vaginal bleeding. This is been going on for almost a year. She takes oral birth control pills to help regulate it but still she says that she is bleeding almost every day of the month. At alternates between lighter and heavier days. The last 2 days been light but today seemed heavy again. The cramping pain is sharp and nonradiating. Pretty constant aching with intermittent pains in severe pain.     Past Medical History  Diagnosis Date  . Asthma      Patient Active Problem List   Diagnosis Date Noted  . Asthma 04/22/2015     History reviewed. No pertinent past surgical history.   Current Outpatient Rx  Name  Route  Sig  Dispense  Refill  . CRYSELLE-28 0.3-30 MG-MCG tablet   Oral   Take 1 tablet by mouth daily.           Dispense as written.   . naproxen (NAPROSYN) 500 MG tablet   Oral   Take 500 mg by mouth 2 (two) times daily with a meal.         . dicyclomine (BENTYL) 20 MG tablet   Oral   Take 1 tablet (20 mg total) by mouth 3 (three) times daily as needed for spasms.   30 tablet   0   . medroxyPROGESTERone (PROVERA) 10 MG tablet   Oral   Take 1 tablet (10 mg total) by mouth daily.   10 tablet   0      Allergies Sulfa antibiotics   No family history on file.  Social History Social History  Substance Use Topics  . Smoking status: Current Every Day Smoker    Types: Cigarettes  . Smokeless tobacco: None  . Alcohol Use: No    Review of Systems  Constitutional:   No fever or chills. No weight changes Eyes:   No vision changes.  ENT:   No sore throat. No rhinorrhea. Cardiovascular:   No chest  pain. Respiratory:   No dyspnea or cough. Gastrointestinal:   Frequent suprapubic cramping pain. No vomiting or diarrhea. Normal oral intake.Marland Kitchen  No BRBPR or melena. Genitourinary:   Negative for dysuria or difficulty urinating. Musculoskeletal:   Negative for focal pain or swelling Skin:   Negative for rash. Neurological:   Negative for headaches, focal weakness or numbness. No dizziness  10-point ROS otherwise negative.  ____________________________________________   PHYSICAL EXAM:  VITAL SIGNS: ED Triage Vitals  Enc Vitals Group     BP 03/06/16 1222 124/81 mmHg     Pulse Rate 03/06/16 1222 70     Resp 03/06/16 1222 20     Temp 03/06/16 1222 98.1 F (36.7 C)     Temp Source 03/06/16 1222 Oral     SpO2 03/06/16 1222 98 %     Weight 03/06/16 1222 215 lb (97.523 kg)     Height 03/06/16 1222  (1.6 m)     Head Cir --      Peak Flow --      Pain Score 03/06/16 1222 10     Pain Loc --      Pain Edu? --      Excl. in GC? --  Vital signs reviewed, nursing assessments reviewed.   Constitutional:   Alert and oriented. Well appearing and in no distress. Eyes:   No scleral icterus. No conjunctival pallor. PERRL. EOMI ENT   Head:   Normocephalic and atraumatic.   Nose:   No congestion/rhinnorhea. No septal hematoma   Mouth/Throat:   MMM, no pharyngeal erythema. No peritonsillar mass.    Neck:   No stridor. No SubQ emphysema. No meningismus. Hematological/Lymphatic/Immunilogical:   No cervical lymphadenopathy. Cardiovascular:   RRR. Symmetric bilateral radial and DP pulses.  No murmurs.  Respiratory:   Normal respiratory effort without tachypnea nor retractions. Breath sounds are clear and equal bilaterally. No wheezes/rales/rhonchi. Gastrointestinal:   Soft and nontender. Non distended. There is no CVA tenderness.  No rebound, rigidity, or guarding. Genitourinary:   deferred Musculoskeletal:   Nontender with normal range of motion in all extremities. No joint  effusions.  No lower extremity tenderness.  No edema. Neurologic:   Normal speech and language.  CN 2-10 normal. Motor grossly intact. No gross focal neurologic deficits are appreciated.  Skin:    Skin is warm, dry and intact. No rash noted.  No petechiae, purpura, or bullae. Psychiatric:   Mood and affect are normal. ____________________________________________    LABS (pertinent positives/negatives) (all labs ordered are listed, but only abnormal results are displayed) Labs Reviewed - No data to display ____________________________________________   EKG    ____________________________________________    RADIOLOGY    ____________________________________________   PROCEDURES   ____________________________________________   INITIAL IMPRESSION / ASSESSMENT AND PLAN / ED COURSE  Pertinent labs & imaging results that were available during my care of the patient were reviewed by me and considered in my medical decision making (see chart for details).  Patient resents with chronic vaginal bleeding. Has previous visits for the same. Has not followed up with gynecology as previously instructed. We'll start her on a course of Provera and have her follow-up with gynecology in 2 weeks. He is medically stable, no evidence of volume depletion or significant anemia.     ____________________________________________   FINAL CLINICAL IMPRESSION(S) / ED DIAGNOSES  Final diagnoses:  Metrorrhagia  DUB (dysfunctional uterine bleeding)      Sharman CheekPhillip Bingham Millette, MD 03/06/16 1343

## 2016-03-06 NOTE — ED Notes (Signed)
NAD noted at time of D/C. Pt denies questions or concerns. Pt ambulatory to the lobby at this time.  

## 2016-06-13 ENCOUNTER — Encounter: Payer: Self-pay | Admitting: Emergency Medicine

## 2016-06-13 ENCOUNTER — Emergency Department
Admission: EM | Admit: 2016-06-13 | Discharge: 2016-06-13 | Disposition: A | Discharge status: Home / Self Care | Payer: BLUE CROSS/BLUE SHIELD | Attending: Emergency Medicine | Admitting: Emergency Medicine

## 2016-06-13 DIAGNOSIS — F17210 Nicotine dependence, cigarettes, uncomplicated: Secondary | ICD-10-CM | POA: Diagnosis not present

## 2016-06-13 DIAGNOSIS — J45909 Unspecified asthma, uncomplicated: Secondary | ICD-10-CM | POA: Diagnosis not present

## 2016-06-13 DIAGNOSIS — Z791 Long term (current) use of non-steroidal anti-inflammatories (NSAID): Secondary | ICD-10-CM | POA: Insufficient documentation

## 2016-06-13 DIAGNOSIS — N939 Abnormal uterine and vaginal bleeding, unspecified: Secondary | ICD-10-CM | POA: Diagnosis present

## 2016-06-13 DIAGNOSIS — N938 Other specified abnormal uterine and vaginal bleeding: Secondary | ICD-10-CM | POA: Diagnosis not present

## 2016-06-13 DIAGNOSIS — Z79899 Other long term (current) drug therapy: Secondary | ICD-10-CM | POA: Insufficient documentation

## 2016-06-13 LAB — BASIC METABOLIC PANEL
Anion gap: 5 (ref 5–15)
BUN: 11 mg/dL (ref 6–20)
CO2: 26 mmol/L (ref 22–32)
CREATININE: 0.85 mg/dL (ref 0.44–1.00)
Calcium: 9.2 mg/dL (ref 8.9–10.3)
Chloride: 107 mmol/L (ref 101–111)
GFR calc Af Amer: >60 mL/min (ref >60)
Glucose, Bld: 111 mg/dL — ABNORMAL HIGH (ref 65–99)
Potassium: 4.6 mmol/L (ref 3.5–5.1)
SODIUM: 138 mmol/L (ref 135–145)

## 2016-06-13 LAB — CBC
HCT: 35.6 % (ref 35.0–47.0)
Hemoglobin: 12 g/dL (ref 12.0–16.0)
MCH: 30.1 pg (ref 26.0–34.0)
MCHC: 33.8 g/dL (ref 32.0–36.0)
MCV: 88.9 fL (ref 80.0–100.0)
PLATELETS: 222 10*3/uL (ref 150–440)
RBC: 4 MIL/uL (ref 3.80–5.20)
RDW: 16.4 % — ABNORMAL HIGH (ref 11.5–14.5)
WBC: 5 10*3/uL (ref 3.6–11.0)

## 2016-06-13 LAB — URINALYSIS COMPLETE WITH MICROSCOPIC (ARMC ONLY)
BILIRUBIN URINE: NEGATIVE
Bacteria, UA: NONE SEEN
Glucose, UA: NEGATIVE mg/dL
KETONES UR: NEGATIVE mg/dL
Leukocytes, UA: NEGATIVE
Nitrite: NEGATIVE
PROTEIN: 100 mg/dL — AB
Specific Gravity, Urine: 1.023 (ref 1.005–1.030)
pH: 5 (ref 5.0–8.0)

## 2016-06-13 LAB — POCT PREGNANCY, URINE: Preg Test, Ur: NEGATIVE

## 2016-06-13 LAB — ABO/RH: ABO/RH(D): O POS

## 2016-06-13 MED ORDER — MEDROXYPROGESTERONE ACETATE 10 MG PO TABS: 10.0000 mg | ORAL_TABLET | Freq: Every day | ORAL | Status: DC

## 2016-06-13 NOTE — ED Notes (Signed)
Pt reports vaginal bleeding for the past several weeks. Increased bleeding several weeks ago going through an overnight pad in a couple of hours.  Bleeding has decreased. Clots have been present.  Pt states she has an appt with OB-GYN next week.  Pt also c/o abdominal cramping that is similar to period cramping

## 2016-06-13 NOTE — ED Provider Notes (Signed)
[facility] Emergency Department Provider Note   ____________________________________________  Time seen: Approximately 340 PM  I have reviewed the triage vital signs and the nursing notes.   HISTORY  Chief Complaint Vaginal Bleeding   HPI Kathy Greene is a 25 y.o. female with a history of menorrhagia who is presenting to the emergency department today with vaginal bleeding over the past 5 weeks she says that at times she has blood clots that are coarse eyes. Denies any lightheadedness or feelings of passing out. Says that she has had minimal bleeding today and has been using the same patterns none M which she says has not soaked. Says that she has had this problem ongoing for months at this time and was seen previously in this emergency department and given medroxyprogesterone which helped to control the bleeding. She says that she has a follow-up appointment at a women[facility] clinic this coming Wednesday. Denies any pain at this time.   Past Medical History  Diagnosis Date  . Asthma     Patient Active Problem List   Diagnosis Date Noted  . Asthma 04/22/2015    History reviewed. No pertinent past surgical history.  Current Outpatient Rx  Name  Route  Sig  Dispense  Refill  . CRYSELLE-28 0.3-30 MG-MCG tablet   Oral   Take 1 tablet by mouth daily.           Dispense as written.   . medroxyPROGESTERone (PROVERA) 10 MG tablet   Oral   Take 1 tablet (10 mg total) by mouth daily.   10 tablet   0   . naproxen (NAPROSYN) 500 MG tablet   Oral   Take 500 mg by mouth 2 (two) times daily with a meal.           Allergies Sulfa antibiotics  No family history on file.  Social History Social History  Substance Use Topics  . Smoking status: Current Every Day Smoker    Types: Cigarettes  . Smokeless tobacco: None  . Alcohol Use: No    Review of Systems Constitutional: No fever/chills Eyes: No visual changes. ENT: No sore  throat. Cardiovascular: Denies chest pain. Respiratory: Denies shortness of breath. Gastrointestinal: No abdominal pain.  No nausea, no vomiting.  No diarrhea.  No constipation. Genitourinary: Negative for dysuria. Musculoskeletal: Negative for back pain. Skin: Negative for rash. Neurological: Negative for headaches, focal weakness or numbness.  10-point ROS otherwise negative.  ____________________________________________   PHYSICAL EXAM:  VITAL SIGNS: ED Triage Vitals  Enc Vitals Group     BP 06/13/16 1037 119/62 mmHg     Pulse Rate 06/13/16 1037 73     Resp 06/13/16 1037 16     Temp 06/13/16 1037 98.5 F (36.9 C)     Temp Source 06/13/16 1037 Oral     SpO2 06/13/16 1037 98 %     Weight 06/13/16 1037 230 lb (104.327 kg)     Height 06/13/16 1037  (1.6 m)     Head Cir --      Peak Flow --      Pain Score 06/13/16 1038 0     Pain Loc --      Pain Edu? --      Excl. in GC? --     Constitutional: Alert and oriented. Well appearing and in no acute distress. Eyes: Conjunctivae are normal. PERRL. EOMI. Head: Atraumatic. Nose: No congestion/rhinnorhea. Mouth/Throat: Mucous membranes are moist.   Neck: No stridor.  Cardiovascular: Normal rate, regular rhythm. Grossly normal heart sounds.   Respiratory: Normal respiratory effort.  No retractions. Lungs CTAB. Gastrointestinal: Soft and nontender. No distention.  Genitourinary: Deferred Musculoskeletal: No lower extremity tenderness nor edema.  No joint effusions. Neurologic:  Normal speech and language. No gross focal neurologic deficits are appreciated.  Skin:  Skin is warm, dry and intact. No rash noted. Psychiatric: Mood and affect are normal. Speech and behavior are normal.  ____________________________________________   LABS (all labs ordered are listed, but only abnormal results are displayed)  Labs Reviewed  BASIC METABOLIC PANEL - Abnormal; Notable for the following:    Glucose, Bld 111 (*)    All other  components within normal limits  URINALYSIS COMPLETEWITH MICROSCOPIC ([facility] ONLY) - Abnormal; Notable for the following:    Color, Urine YELLOW (*)    APPearance CLOUDY (*)    Hgb urine dipstick 3+ (*)    Protein, ur 100 (*)    Squamous Epithelial / LPF 0-5 (*)    All other components within normal limits  CBC - Abnormal; Notable for the following:    RDW 16.4 (*)    All other components within normal limits  POC URINE PREG, ED  POCT PREGNANCY, URINE  ABO/RH   ____________________________________________  EKG   ____________________________________________  RADIOLOGY   ____________________________________________   PROCEDURES  Procedures  ____________________________________________   INITIAL IMPRESSION / ASSESSMENT AND PLAN / ED COURSE  Pertinent labs & imaging results that were available during my care of the patient were reviewed by me and considered in my medical decision making (see chart for details).  ----------------------------------------- 4:45 PM on 06/13/2016 -----------------------------------------  Patient with very reassuring blood work with a normal hemoglobin. Will put back on progesterone and the patient will be following up with her OB/GYN this coming Wednesday. A splint to splint the patient and she is understanding and willing to comply. ____________________________________________   FINAL CLINICAL IMPRESSION(S) / ED DIAGNOSES  Dysfunctional uterine bleeding.    NEW MEDICATIONS STARTED DURING THIS VISIT:  New Prescriptions   No medications on file     Note:  This document was prepared using Dragon voice recognition software and may include unintentional dictation errors.    Myrna Blazeravid Matthew Aneliese Beaudry, MD 06/13/16 [phone]

## 2016-06-13 NOTE — ED Notes (Signed)
Lab notified to add CBC to blood work.

## 2016-06-13 NOTE — ED Notes (Signed)
Pt presents with vaginal bleeding since the month of June. Pt states this has happened before. Denies any abd cramping.

## 2016-06-13 NOTE — Discharge Instructions (Signed)

## 2016-06-13 NOTE — ED Notes (Signed)
Lab called stating unable to draw CBC from previous lab work due to drawing the ABO/RH first.

## 2016-07-20 ENCOUNTER — Encounter: Payer: Self-pay | Admitting: Emergency Medicine

## 2016-07-20 ENCOUNTER — Emergency Department
Admission: EM | Admit: 2016-07-20 | Discharge: 2016-07-20 | Disposition: A | Discharge status: Home / Self Care | Payer: BLUE CROSS/BLUE SHIELD | Attending: Emergency Medicine | Admitting: Emergency Medicine

## 2016-07-20 DIAGNOSIS — F17210 Nicotine dependence, cigarettes, uncomplicated: Secondary | ICD-10-CM | POA: Diagnosis not present

## 2016-07-20 DIAGNOSIS — J45909 Unspecified asthma, uncomplicated: Secondary | ICD-10-CM | POA: Diagnosis not present

## 2016-07-20 DIAGNOSIS — N3001 Acute cystitis with hematuria: Secondary | ICD-10-CM | POA: Diagnosis not present

## 2016-07-20 DIAGNOSIS — R319 Hematuria, unspecified: Secondary | ICD-10-CM | POA: Diagnosis present

## 2016-07-20 HISTORY — DX: Irregular menstruation, unspecified: N92.6

## 2016-07-20 LAB — URINALYSIS COMPLETE WITH MICROSCOPIC (ARMC ONLY)
Bilirubin Urine: NEGATIVE
GLUCOSE, UA: NEGATIVE mg/dL
Ketones, ur: NEGATIVE mg/dL
NITRITE: NEGATIVE
PROTEIN: 30 mg/dL — AB
SPECIFIC GRAVITY, URINE: 1.012 (ref 1.005–1.030)
pH: 5 (ref 5.0–8.0)

## 2016-07-20 LAB — POCT PREGNANCY, URINE: PREG TEST UR: NEGATIVE

## 2016-07-20 MED ORDER — CEPHALEXIN 500 MG PO CAPS: 500.0000 mg | ORAL_CAPSULE | Freq: Four times a day (QID) | ORAL | 0 refills | Status: DC

## 2016-07-20 NOTE — Discharge Instructions (Signed)
Follow-up with your doctor after finishing the antibiotic. Increase fluids. Tylenol if needed for pain.

## 2016-07-20 NOTE — ED Notes (Signed)
NAD noted at time of D/C. Pt denies questions or concerns. Pt ambulatory to the lobby at this time.  

## 2016-07-20 NOTE — ED Provider Notes (Signed)
[facility] Emergency Department Provider Note  ____________________________________________   First MD Initiated Contact with Patient 07/20/16 1537     (approximate)  I have reviewed the triage vital signs and the nursing notes.   HISTORY  Chief Complaint Dysuria and Vaginal Bleeding   HPI Kathy Greene is a 25 y.o. female is here today with complaint of hematuria. Patient is confused whether this is vaginal blood or hematuria. Patient states that she has irregular menses and that her last menses was a month ago. Patient has urinary symptoms. She denies any nausea, vomiting or fever and chills. Patient rates her pain as 6/10 at this time. Patient denies any history of kidney stones.     Past Medical History:  Diagnosis Date  . Asthma   . Irregular periods     Patient Active Problem List   Diagnosis Date Noted  . Asthma 04/22/2015    History reviewed. No pertinent surgical history.  Prior to Admission medications   Medication Sig Start Date End Date Taking? Authorizing Provider  cephALEXin (KEFLEX) 500 MG capsule Take 1 capsule (500 mg total) by mouth 4 (four) times daily. 07/20/16   Tommi Rumpshonda L June Vacha, PA-C  CRYSELLE-28 0.3-30 MG-MCG tablet Take 1 tablet by mouth daily. 02/11/16   Historical Provider, MD  medroxyPROGESTERone (PROVERA) 10 MG tablet Take 1 tablet (10 mg total) by mouth daily. 06/13/16 06/13/17  Myrna Blazeravid Matthew Schaevitz, MD  naproxen (NAPROSYN) 500 MG tablet Take 500 mg by mouth 2 (two) times daily with a meal.    Historical Provider, MD    Allergies Sulfa antibiotics  History reviewed. No pertinent family history.  Social History Social History  Substance Use Topics  . Smoking status: Current Every Day Smoker    Packs/day: 0.50    Types: Cigarettes  . Smokeless tobacco: Never Used  . Alcohol use No    Review of Systems Constitutional: No fever/chills Cardiovascular: Denies chest pain. Respiratory: Denies shortness of  breath. Gastrointestinal: No abdominal pain.  No nausea, no vomiting.  Genitourinary: Negative for dysuria.Positive for questionable hematuria. Musculoskeletal: Negative for back pain. Skin: Negative for rash. Neurological: Negative for headaches, focal weakness or numbness.  10-point ROS otherwise negative.  ____________________________________________   PHYSICAL EXAM:  VITAL SIGNS: ED Triage Vitals [07/20/16 1328]  Enc Vitals Group     BP (!) 126/58     Pulse Rate 77     Resp 16     Temp 98.4 F (36.9 C)     Temp src      SpO2 96 %     Weight 215 lb (97.5 kg)     Height 5\' 3"  (1.6 m)     Head Circumference      Peak Flow      Pain Score 6     Pain Loc      Pain Edu?      Excl. in GC?     Constitutional: Alert and oriented. Well appearing and in no acute distress. Eyes: Conjunctivae are normal. PERRL. EOMI. Head: Atraumatic. Nose: No congestion/rhinnorhea. Neck: No stridor.   Cardiovascular: Normal rate, regular rhythm. Grossly normal heart sounds.  Good peripheral circulation. Respiratory: Normal respiratory effort.  No retractions. Lungs CTAB. Musculoskeletal: Moves upper and lower extremities without difficulty. Normal gait was noted. Neurologic:  Normal speech and language. No gross focal neurologic deficits are appreciated. No gait instability. Skin:  Skin is warm, dry and intact.  Psychiatric: Mood and affect are normal. Speech and behavior are normal.  ____________________________________________   LABS (all labs ordered are listed, but only abnormal results are displayed)  Labs Reviewed  URINALYSIS COMPLETEWITH MICROSCOPIC ([facility] ONLY) - Abnormal; Notable for the following:       Result Value   Color, Urine YELLOW (*)    APPearance CLOUDY (*)    Hgb urine dipstick 2+ (*)    Protein, ur 30 (*)    Leukocytes, UA 2+ (*)    Bacteria, UA MANY (*)    Squamous Epithelial / LPF 6-30 (*)    All other components within normal limits  URINE CULTURE  POC  URINE PREG, ED  POCT PREGNANCY, URINE    PROCEDURES  Procedure(s) performed: None  Procedures  Critical Care performed: No  ____________________________________________   INITIAL IMPRESSION / ASSESSMENT AND PLAN / ED COURSE  Pertinent labs & imaging results that were available during my care of the patient were reviewed by me and considered in my medical decision making (see chart for details).    Clinical Course   Patient gave a urine specimen while in triage that was a clean catch. Urine was ordered with patient was in the examination room. Tech attempted without success to obtain urine. There was no vaginal blood noted and patient has no vaginal bleeding at this time.  It is most likely that patient has a hemorrhagic cystitis and had no vaginal blood from the beginning. Patient was started on Keflex 500 mg 4 times a day for 10 days. Patient is [place] if any continued problems. She is also encouraged to drink fluids.  ____________________________________________   FINAL CLINICAL IMPRESSION(S) / ED DIAGNOSES  Final diagnoses:  Acute hemorrhagic cystitis      NEW MEDICATIONS STARTED DURING THIS VISIT:  Discharge Medication List as of 07/20/2016  3:45 PM    START taking these medications   Details  cephALEXin (KEFLEX) 500 MG capsule Take 1 capsule (500 mg total) by mouth 4 (four) times daily., Starting Sun 07/20/2016, Print         Note:  This document was prepared using Dragon voice recognition software and may include unintentional dictation errors.    Tommi RumpsRhonda L Bellagrace Sylvan, PA-C 07/20/16 1626    Emily FilbertJonathan E Williams, MD 07/21/16 [phone]

## 2016-07-20 NOTE — ED Notes (Signed)
Urine Pregnancy negative per POCT

## 2016-07-20 NOTE — ED Triage Notes (Signed)
Pt states pain with urination. Started vaginal bleeding last night. Last menses a month ago. States is irregular and wasn't sure if it had to do with the dysuria

## 2016-07-20 NOTE — ED Notes (Signed)
Pt states was coming in for dysmenorrhia and was placed on birth control pills which has controlled it.

## 2016-07-22 LAB — URINE CULTURE

## 2016-07-23 NOTE — Progress Notes (Signed)
ED Culture Results   25yo female Allergies: sulfa antibiotics Visit Date:07/20/16 Chief Complaint: dysuria and vaginal bleeding Culture Type: urine Culture Results: E. coli Original Abx given: Cephalexin Original Abx sensitive, intermediate, or resistant: sensitive Recommended Abx:  ED Physician: Contacted Patient: No, on appropriate therapy Prescription Called into:   Delsa BernKelly m Fuhrmann, [facility] 12:19 PM 07/23/2016

## 2017-01-19 ENCOUNTER — Emergency Department
Admission: EM | Admit: 2017-01-19 | Discharge: 2017-01-19 | Disposition: A | Discharge status: Home / Self Care | Payer: BLUE CROSS/BLUE SHIELD | Attending: Emergency Medicine | Admitting: Emergency Medicine

## 2017-01-19 ENCOUNTER — Encounter: Payer: Self-pay | Admitting: Emergency Medicine

## 2017-01-19 DIAGNOSIS — F17210 Nicotine dependence, cigarettes, uncomplicated: Secondary | ICD-10-CM | POA: Diagnosis not present

## 2017-01-19 DIAGNOSIS — J45909 Unspecified asthma, uncomplicated: Secondary | ICD-10-CM | POA: Diagnosis not present

## 2017-01-19 DIAGNOSIS — Z5321 Procedure and treatment not carried out due to patient leaving prior to being seen by health care provider: Secondary | ICD-10-CM | POA: Diagnosis not present

## 2017-01-19 DIAGNOSIS — N764 Abscess of vulva: Secondary | ICD-10-CM | POA: Diagnosis present

## 2017-01-19 NOTE — ED Triage Notes (Signed)
Pt states has a nickel sized "bump" on "vagina". Pt points to mons when stating where "bump" is located. Pt states it has clear drainage. Pt states "i tried to pop it myself,maybe that wasn't the best idea." pt appears in no acute distress.

## 2017-01-20 ENCOUNTER — Encounter: Payer: Self-pay | Admitting: Medical Oncology

## 2017-01-20 ENCOUNTER — Emergency Department
Admission: EM | Admit: 2017-01-20 | Discharge: 2017-01-20 | Disposition: A | Discharge status: Home / Self Care | Payer: BLUE CROSS/BLUE SHIELD | Attending: Emergency Medicine | Admitting: Emergency Medicine

## 2017-01-20 DIAGNOSIS — L0293 Carbuncle, unspecified: Secondary | ICD-10-CM

## 2017-01-20 DIAGNOSIS — N764 Abscess of vulva: Secondary | ICD-10-CM | POA: Diagnosis present

## 2017-01-20 DIAGNOSIS — F17210 Nicotine dependence, cigarettes, uncomplicated: Secondary | ICD-10-CM | POA: Insufficient documentation

## 2017-01-20 DIAGNOSIS — Z791 Long term (current) use of non-steroidal anti-inflammatories (NSAID): Secondary | ICD-10-CM | POA: Insufficient documentation

## 2017-01-20 DIAGNOSIS — J45909 Unspecified asthma, uncomplicated: Secondary | ICD-10-CM | POA: Insufficient documentation

## 2017-01-20 LAB — GLUCOSE, CAPILLARY: GLUCOSE-CAPILLARY: 89 mg/dL (ref 65–99)

## 2017-01-20 MED ORDER — CEPHALEXIN 500 MG PO CAPS: 500.0000 mg | ORAL_CAPSULE | Freq: Four times a day (QID) | ORAL | 0 refills | Status: DC

## 2017-01-20 NOTE — ED Notes (Signed)
See triage note  States she noticed a swollen area to her vaginal area couple of days ago

## 2017-01-20 NOTE — Discharge Instructions (Signed)
Apply warm compresses frequently to the area. Start antibiotics today Keflex 500 mg 4 times a day until finished. [facility] if any [facility] problems. You may also take Tylenol or ibuprofen as needed for pain.

## 2017-01-20 NOTE — ED Triage Notes (Signed)
Pt reports that she has a "bump" on her vagina, tried to pop it herself. NAD noted. Pt was here last night but LWOT.

## 2017-01-20 NOTE — ED Provider Notes (Signed)
[facility] Emergency Department Provider Note   ____________________________________________   First MD Initiated Contact with Patient 01/20/17 1115     (approximate)  I have reviewed the triage vital signs and the nursing notes.   HISTORY  Chief Complaint Abscess    HPI Kathy Greene is a 26 y.o. female is here complaining of "bump" near her vagina that she has tried to pop herself without any success. Patient states she noticed a swollen area several days ago and gradually his become more, called back. Patient states that she was here last night but left without being seen. She has not taken any over-the-counter medication for her pain. She is also not used any warm compresses to the area. She denies any previous history of MRSA. She rates her pain as a 3 out of 10.   Past Medical History:  Diagnosis Date  . Asthma   . Irregular periods     Patient Active Problem List   Diagnosis Date Noted  . Asthma 04/22/2015    History reviewed. No pertinent surgical history.  Prior to Admission medications   Medication Sig Start Date End Date Taking? Authorizing Provider  cephALEXin (KEFLEX) 500 MG capsule Take 1 capsule (500 mg total) by mouth 4 (four) times daily. 01/20/17   Tommi Rumps, PA-C  CRYSELLE-28 0.3-30 MG-MCG tablet Take 1 tablet by mouth daily. 02/11/16   Historical Provider, MD  naproxen (NAPROSYN) 500 MG tablet Take 500 mg by mouth 2 (two) times daily with a meal.    Historical Provider, MD    Allergies Sulfa antibiotics  No family history on file.  Social History Social History  Substance Use Topics  . Smoking status: Current Every Day Smoker    Packs/day: 0.50    Types: Cigarettes  . Smokeless tobacco: Never Used  . Alcohol use No    Review of Systems Constitutional: No fever/chills Cardiovascular: Denies chest pain. Respiratory: Denies shortness of breath. Gastrointestinal: No abdominal pain.  No nausea, no vomiting.    Genitourinary: Negative for dysuria. Musculoskeletal: Negative for back pain. Skin: Positive for infection. Neurological: Negative for headaches, focal weakness or numbness.  10-point ROS otherwise negative.  ____________________________________________   PHYSICAL EXAM:  VITAL SIGNS: ED Triage Vitals [01/20/17 1044]  Enc Vitals Group     BP 122/63     Pulse Rate 68     Resp 18     Temp 98.1 F (36.7 C)     Temp Source Oral     SpO2 98 %     Weight 240 lb (108.9 kg)     Height 5\' 4"  (1.626 m)     Head Circumference      Peak Flow      Pain Score 3     Pain Loc      Pain Edu?      Excl. in GC?     Constitutional: Alert and oriented. Well appearing and in no acute distress. Eyes: Conjunctivae are normal. PERRL. EOMI. Head: Atraumatic. Nose: No congestion/rhinnorhea. Neck: No stridor.   Cardiovascular: Normal rate, regular rhythm. Grossly normal heart sounds.  Good peripheral circulation. Respiratory: Normal respiratory effort.  No retractions. Lungs CTAB. Gastrointestinal: Soft and nontender. No distention.  Genitourinary: On examination patient shaves in the pubic area. The area in question is present in this area. There is no redness noted. There is no evidence of drainage. There is a small nodule without fluctuance with a hair follicle centrally in this area. Musculoskeletal: No  lower extremity tenderness nor edema.  No joint effusions. Neurologic:  Normal speech and language. No gross focal neurologic deficits are appreciated. No gait instability. Skin:  Skin is warm, dry and intact. As described above. Psychiatric: Mood and affect are normal. Speech and behavior are normal.  ____________________________________________   LABS (all labs ordered are listed, but only abnormal results are displayed)  Labs Reviewed  GLUCOSE, CAPILLARY  CBG MONITORING, ED    PROCEDURES  Procedure(s) performed: None  Procedures  Critical Care performed:  No  ____________________________________________   INITIAL IMPRESSION / ASSESSMENT AND PLAN / ED COURSE  Pertinent labs & imaging results that were available during my care of the patient were reviewed by me and considered in my medical decision making (see chart for details).  Patient was instructed to use warm compresses to the area frequently. She was started on Keflex 500 mg 4 times a day for 7 days. She is also to throw the razor way that she has been using and also not to shave in this area until this area has healed. She is [facility] if any continued problems as she does not have a primary care doctor. She'll take Tylenol or ibuprofen as needed for pain.      ____________________________________________   FINAL CLINICAL IMPRESSION(S) / ED DIAGNOSES  Final diagnoses:  Carbuncle      NEW MEDICATIONS STARTED DURING THIS VISIT:  Discharge Medication List as of 01/20/2017 12:06 PM       Note:  This document was prepared using Dragon voice recognition software and may include unintentional dictation errors.    Tommi Rumpshonda L Summers, PA-C 01/20/17 1323    Rockne MenghiniAnne-Caroline Norman, MD 01/20/17 [phone]

## 2017-01-20 NOTE — ED Notes (Signed)
Pt alert and oriented X4, active, cooperative, pt in NAD. RR even and unlabored, color WNL.  Pt informed to return if any life threatening symptoms occur.   

## 2017-03-14 IMAGING — CR DG CHEST 2V
1 series · 2 of 2 positions shown · non-contrast
Comparison: 05/08/2014

CLINICAL DATA: Cough, chest pain for 1 week.

EXAM:
CHEST  2 VIEW

[Series 2: w chest lat · 0.14mm/px · 2 of 2 slices shown]
[im 1/2]
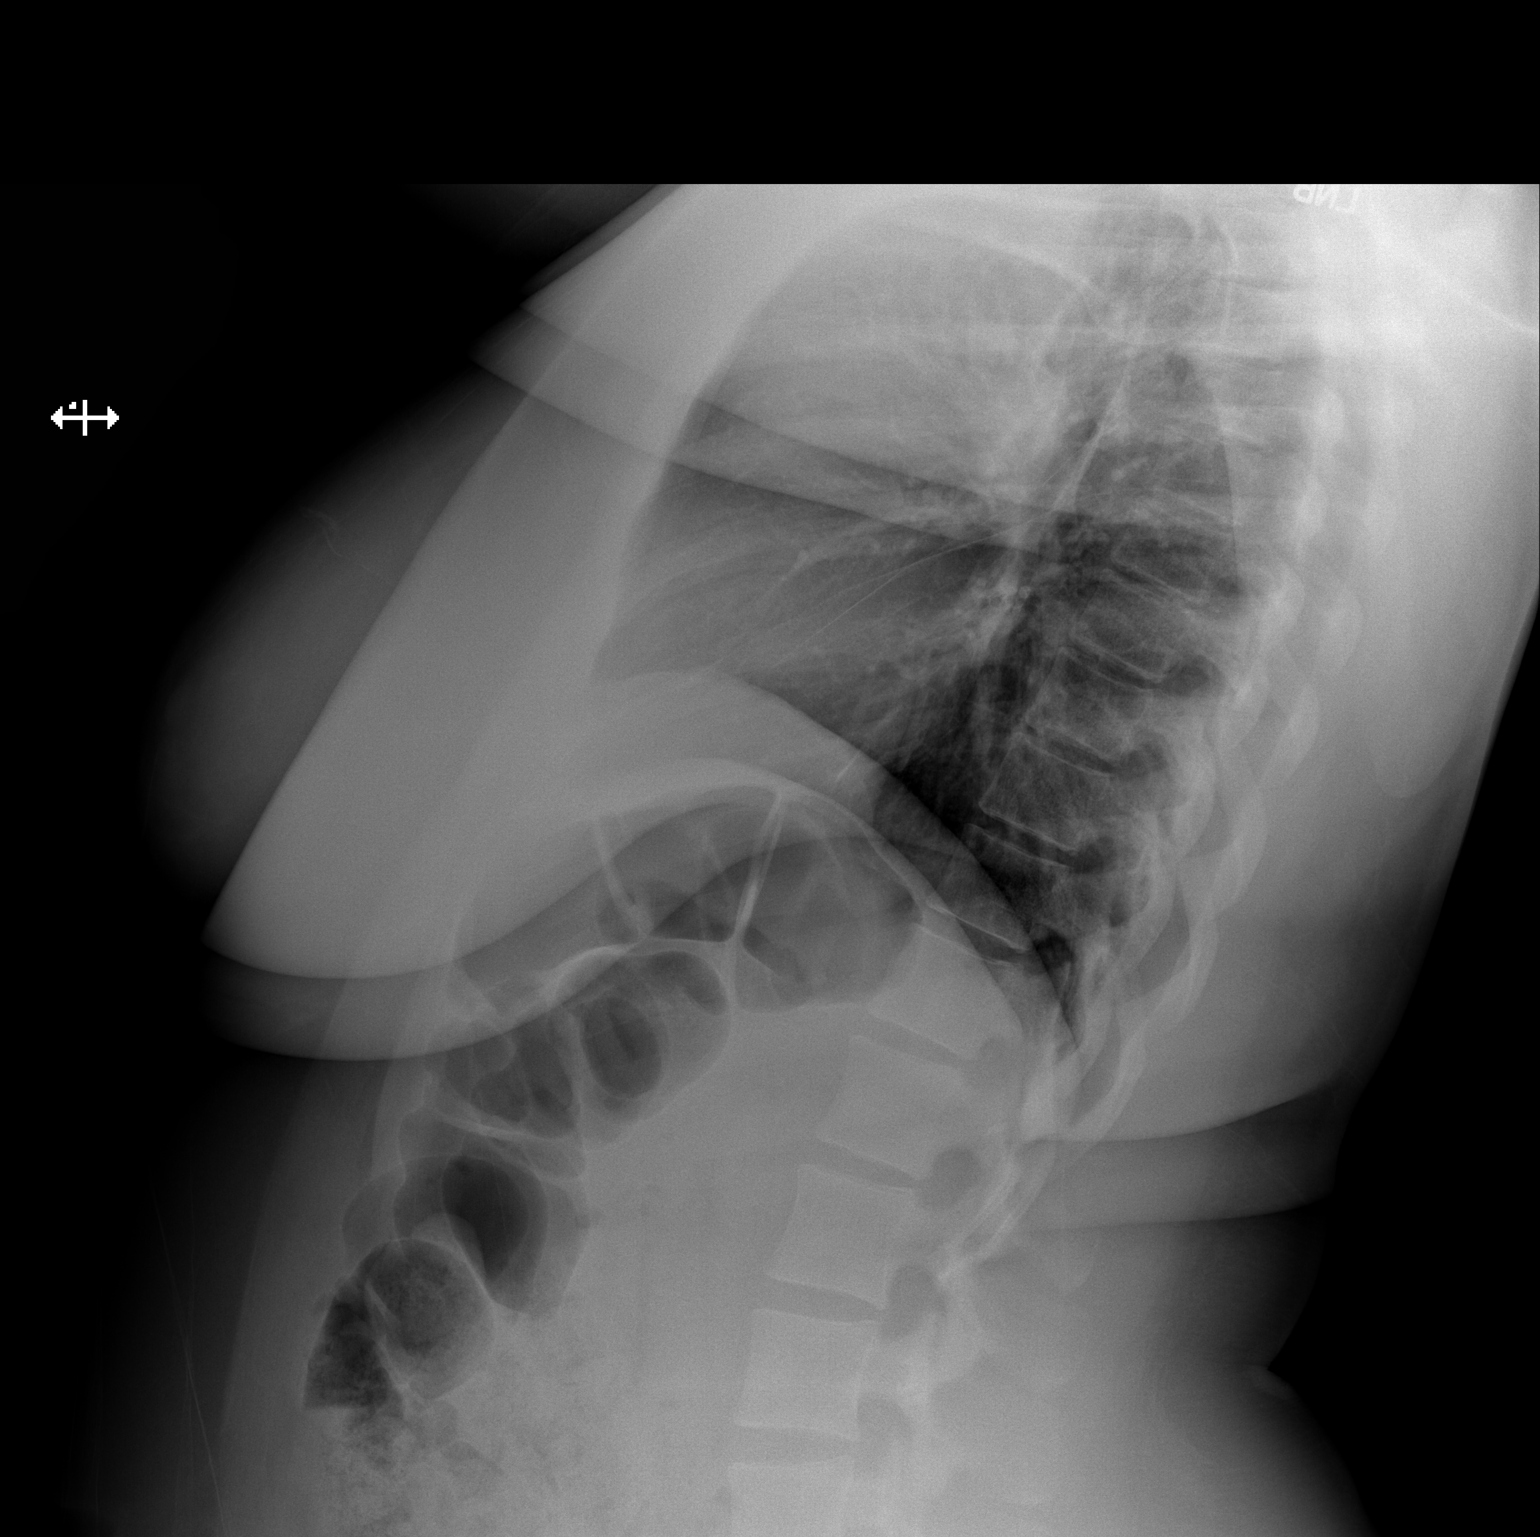
[im 2/2]
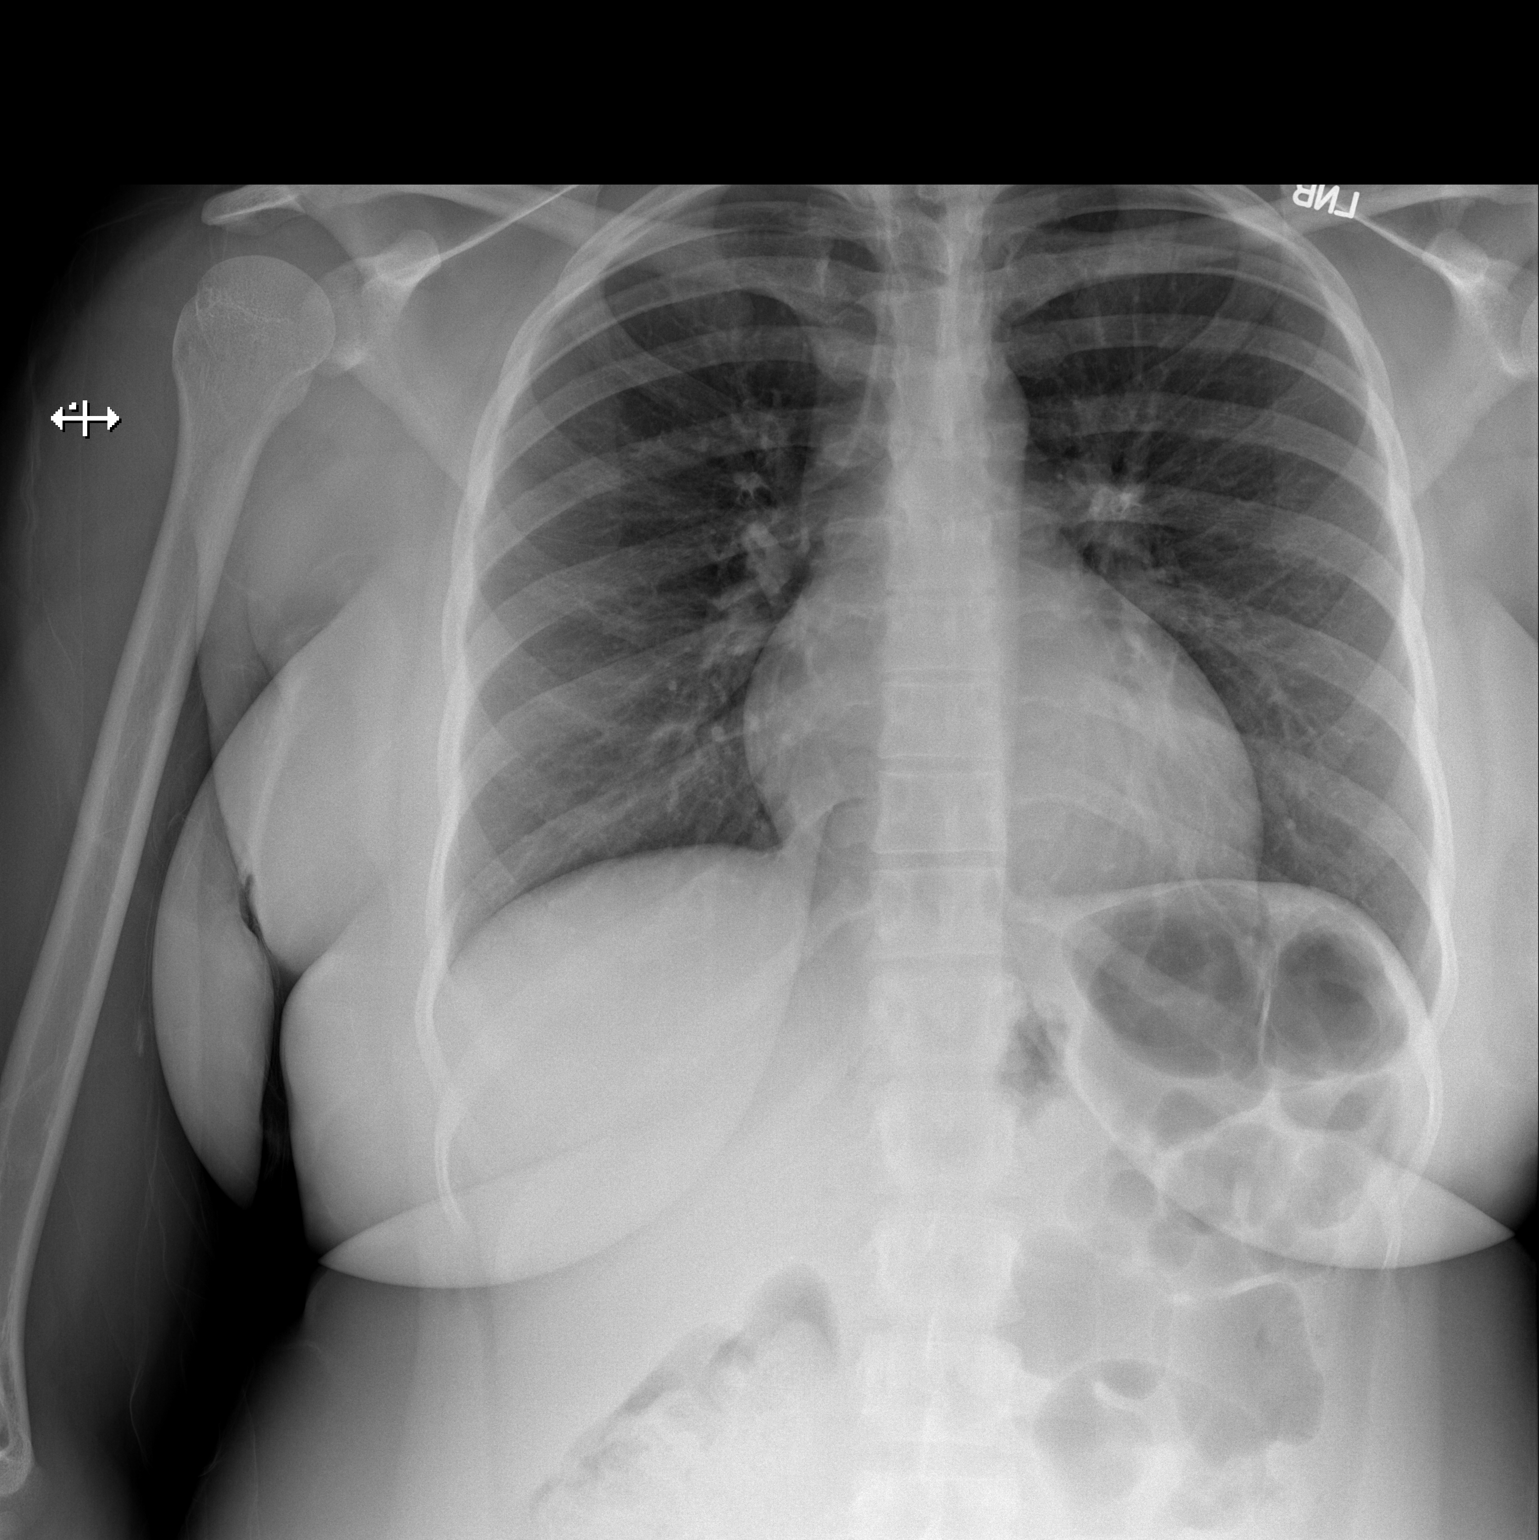

[2 of 2 positions shown; findings below may reference images not displayed]

FINDINGS: The heart size and mediastinal contours are within normal limits.
Both lungs are clear. The visualized skeletal structures are
unremarkable.
IMPRESSION: No active cardiopulmonary disease.

## 2017-06-14 ENCOUNTER — Encounter: Payer: Self-pay | Admitting: Emergency Medicine

## 2017-06-14 ENCOUNTER — Emergency Department
Admission: EM | Admit: 2017-06-14 | Discharge: 2017-06-14 | Disposition: A | Discharge status: Home / Self Care | Payer: BLUE CROSS/BLUE SHIELD | Attending: Emergency Medicine | Admitting: Emergency Medicine

## 2017-06-14 DIAGNOSIS — F17210 Nicotine dependence, cigarettes, uncomplicated: Secondary | ICD-10-CM | POA: Diagnosis not present

## 2017-06-14 DIAGNOSIS — J45909 Unspecified asthma, uncomplicated: Secondary | ICD-10-CM | POA: Diagnosis not present

## 2017-06-14 DIAGNOSIS — Z79899 Other long term (current) drug therapy: Secondary | ICD-10-CM | POA: Diagnosis not present

## 2017-06-14 DIAGNOSIS — M545 Low back pain: Secondary | ICD-10-CM | POA: Insufficient documentation

## 2017-06-14 DIAGNOSIS — G8929 Other chronic pain: Secondary | ICD-10-CM | POA: Diagnosis not present

## 2017-06-14 LAB — URINALYSIS, COMPLETE (UACMP) WITH MICROSCOPIC
BILIRUBIN URINE: NEGATIVE
Bacteria, UA: NONE SEEN
GLUCOSE, UA: NEGATIVE mg/dL
Hgb urine dipstick: NEGATIVE
KETONES UR: NEGATIVE mg/dL
LEUKOCYTES UA: NEGATIVE
NITRITE: NEGATIVE
PH: 5 (ref 5.0–8.0)
Protein, ur: NEGATIVE mg/dL
SPECIFIC GRAVITY, URINE: 1.023 (ref 1.005–1.030)

## 2017-06-14 LAB — POCT PREGNANCY, URINE: Preg Test, Ur: NEGATIVE

## 2017-06-14 MED ORDER — MELOXICAM 7.5 MG PO TABS: 7.5000 mg | ORAL_TABLET | Freq: Every day | ORAL | 1 refills | Status: AC

## 2017-06-14 NOTE — ED Triage Notes (Signed)
Patient with complaint of lower back pain radiating to her abdomen bilateral times 3 days. Patient denies urinary symptoms.

## 2017-06-14 NOTE — ED Notes (Signed)
Pt states she has low back pain that radiates to bilateral lower quadrants. Pt states pain increases with bowel movement. Pt denies dysuria, known hematuria. Pt denies fever. Pt is ambulatory without difficulty.

## 2017-06-14 NOTE — ED Triage Notes (Signed)
Pt states that she did not have a period last month and is having a very light and spotty period now. Pt reports that her lower back has been hurting for three days and denies trauma of any kind. Pt is ambulatory to triage with NAD noted at this time.

## 2017-06-15 NOTE — ED Provider Notes (Signed)
[facility] Emergency Department Provider Note  ____________________________________________  Time seen: Approximately 7:30 PM  I have reviewed the triage vital signs and the nursing notes.   HISTORY  Chief Complaint Back Pain    HPI Kathy Greene is a 26 y.o. female presenting to the emergency department with bilateral low back pain. Patient denies dysuria, hematuria or increased urinary frequency. She has been afebrile. Patient states that she did not have a period last month and her period has been irregular this month. Patient denies trauma or mechanisms of injury. Patient has had chronic back pain throughout her life. She denies chest pain, chest tightness, shortness of breath, nausea, vomiting abdominal pain. Triage note noted. Patient has been afebrile.   Past Medical History:  Diagnosis Date  . Asthma   . Irregular periods     Patient Active Problem List   Diagnosis Date Noted  . Asthma 04/22/2015    History reviewed. No pertinent surgical history.  Prior to Admission medications   Medication Sig Start Date End Date Taking? Authorizing Provider  cephALEXin (KEFLEX) 500 MG capsule Take 1 capsule (500 mg total) by mouth 4 (four) times daily. 01/20/17   Tommi Rumps, PA-C  CRYSELLE-28 0.3-30 MG-MCG tablet Take 1 tablet by mouth daily. 02/11/16   [provider]  meloxicam (MOBIC) 7.5 MG tablet Take 1 tablet (7.5 mg total) by mouth daily. 06/14/17 06/21/17  Orvil Feil, PA-C  naproxen (NAPROSYN) 500 MG tablet Take 500 mg by mouth 2 (two) times daily with a meal.    [provider]    Allergies Sulfa antibiotics  No family history on file.  Social History Social History  Substance Use Topics  . Smoking status: Current Every Day Smoker    Packs/day: 0.50    Types: Cigarettes  . Smokeless tobacco: Never Used  . Alcohol use No     Review of Systems  Constitutional: No fever/chills Eyes: No visual changes. No  discharge ENT: No upper respiratory complaints. Cardiovascular: no chest pain. Respiratory: no cough. No SOB. Gastrointestinal: No abdominal pain.  No nausea, no vomiting.  No diarrhea.  No constipation. Musculoskeletal: Patient has low back pain. Skin: Negative for rash, abrasions, lacerations, ecchymosis. Neurological: Negative for headaches, focal weakness or numbness.  ____________________________________________   PHYSICAL EXAM:  VITAL SIGNS: ED Triage Vitals [06/14/17 1959]  Enc Vitals Group     BP 123/72     Pulse Rate 73     Resp 18     Temp 98.8 F (37.1 C)     Temp Source Oral     SpO2 98 %     Weight 225 lb (102.1 kg)     Height 5\' 3"  (1.6 m)     Head Circumference      Peak Flow      Pain Score 8     Pain Loc      Pain Edu?      Excl. in GC?      Constitutional: Alert and oriented. Well appearing and in no acute distress. Eyes: Conjunctivae are normal. PERRL. EOMI. Head: Atraumatic. Cardiovascular: Normal rate, regular rhythm. Normal S1 and S2.  Good peripheral circulation. Respiratory: Normal respiratory effort without tachypnea or retractions. Lungs CTAB. Good air entry to the bases with no decreased or absent breath sounds. Gastrointestinal: Bowel sounds 4 quadrants. Soft and nontender to palpation. No guarding or rigidity. No palpable masses. No distention. No CVA tenderness. Musculoskeletal:Patient has 5/5 strength in the upper and lower  extremities bilaterally. Full range of motion at the shoulder, elbow and wrist bilaterally. Full range of motion at the hip, knee and ankle bilaterally. No changes in gait. Patient has paraspinal muscle tenderness. Negative straight leg raise test bilaterally. Neurologic:  Normal speech and language. No gross focal neurologic deficits are appreciated.  Skin:  Skin is warm, dry and intact. No rash noted. Psychiatric: Mood and affect are normal. Speech and behavior are normal. Patient exhibits appropriate insight and  judgement.   ____________________________________________   LABS (all labs ordered are listed, but only abnormal results are displayed)  Labs Reviewed  URINALYSIS, COMPLETE (UACMP) WITH MICROSCOPIC - Abnormal; Notable for the following:       Result Value   Color, Urine YELLOW (*)    APPearance CLEAR (*)    Squamous Epithelial / LPF 0-5 (*)    All other components within normal limits  POCT PREGNANCY, URINE   ____________________________________________  EKG   ____________________________________________  RADIOLOGY   No results found.  ____________________________________________    PROCEDURES  Procedure(s) performed:    Procedures    Medications - No data to display   ____________________________________________   INITIAL IMPRESSION / ASSESSMENT AND PLAN / ED COURSE  Pertinent labs & imaging results that were available during my care of the patient were reviewed by me and considered in my medical decision making (see chart for details).  Review of the [place] CSRS was performed in accordance of the [facility] prior to dispensing any controlled drugs.  Clinical Course as of Jun 15 1929  [name] Jun 14, 2017  2229 Color, Urine: Herma Carson(!) YELLOW [JW]    Clinical Course User Index [JW] Pia MauWoods, Acacia Latorre M, PA-C    Assessment and plan: Low back pain Patient presents to the emergency department with low back pain without radiculopathy. Neurologic exam and overall physical exam is reassuring. Patient was discharged with meloxicam. Patient was advised to follow-up with primary care as needed. Vital signs are reassuring prior to discharge. All patient questions were answered.   ____________________________________________  FINAL CLINICAL IMPRESSION(S) / ED DIAGNOSES  Final diagnoses:  Chronic bilateral low back pain without sciatica      NEW MEDICATIONS STARTED DURING THIS VISIT:  Discharge Medication List as of 06/14/2017 10:39 PM    START taking these medications    Details  meloxicam (MOBIC) 7.5 MG tablet Take 1 tablet (7.5 mg total) by mouth daily., Starting Sun 06/14/2017, Until Sun 06/21/2017, Print            This chart was dictated using voice recognition software/Dragon. Despite best efforts to proofread, errors can occur which can change the meaning. Any change was purely unintentional.    Orvil FeilWoods, Arielis Leonhart M, PA-C 06/15/17 1939    Phineas SemenGoodman, Gray[place], MD 06/15/17 [phone]

## 2017-06-16 IMAGING — US US PELVIS COMPLETE
1 series · 13 of 25 positions shown · non-contrast
Comparison: None

CLINICAL DATA: Subacute onset of menorrhagia.  Initial encounter.

EXAM:
TRANSABDOMINAL AND TRANSVAGINAL ULTRASOUND OF PELVIS
TECHNIQUE: Both transabdominal and transvaginal ultrasound examinations of the
pelvis were performed. Transabdominal technique was performed for
global imaging of the pelvis including uterus, ovaries, adnexal
regions, and pelvic cul-de-sac. It was necessary to proceed with
endovaginal exam following the transabdominal exam to visualize the
uterus and ovaries in greater detail.

[Series 1: us pelvis complete · 0.22mm/px · 13 of 65 slices shown]
[im 1/65]
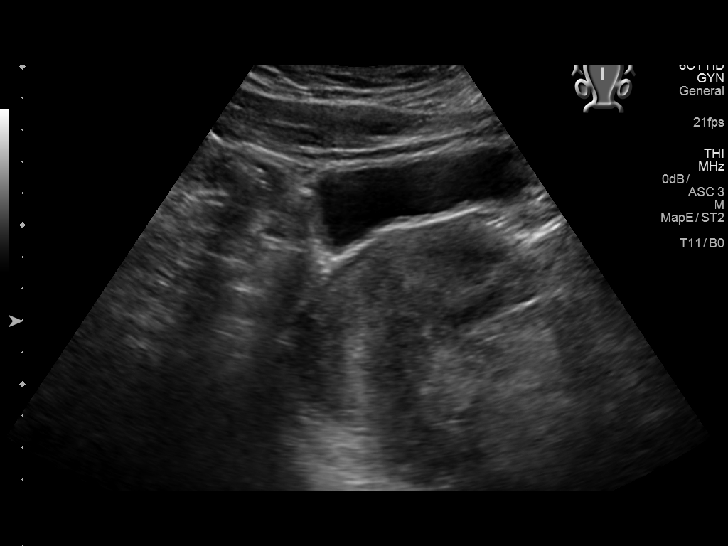
[im 6/65]
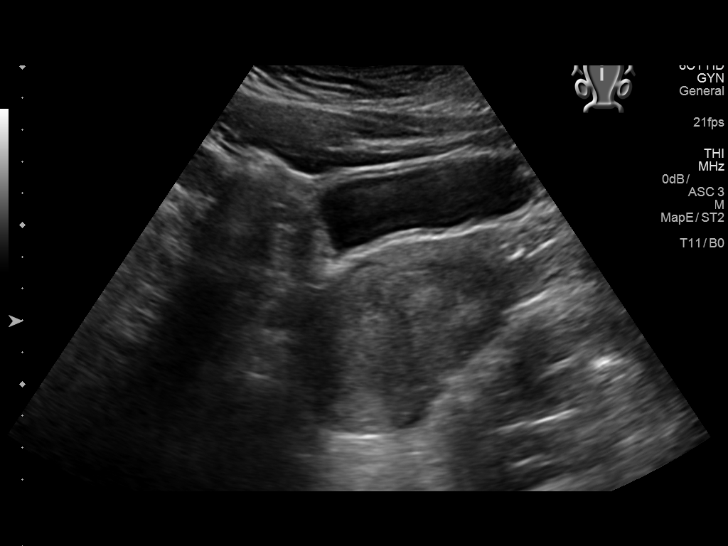
[im 11/65]
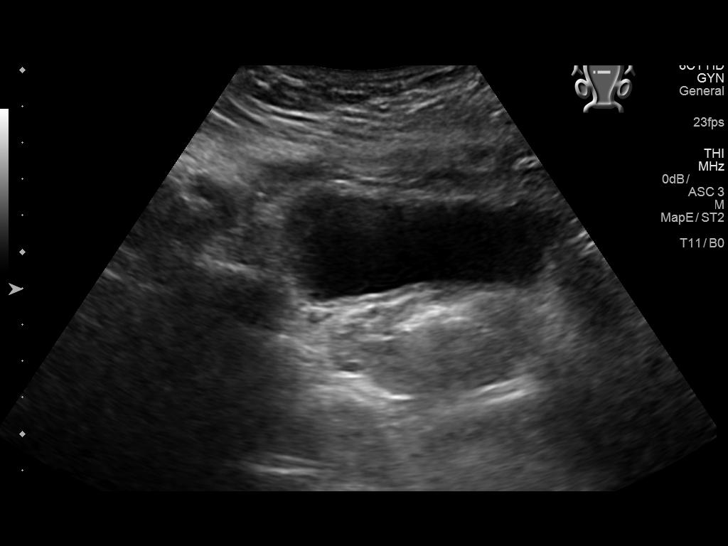
[im 17/65]
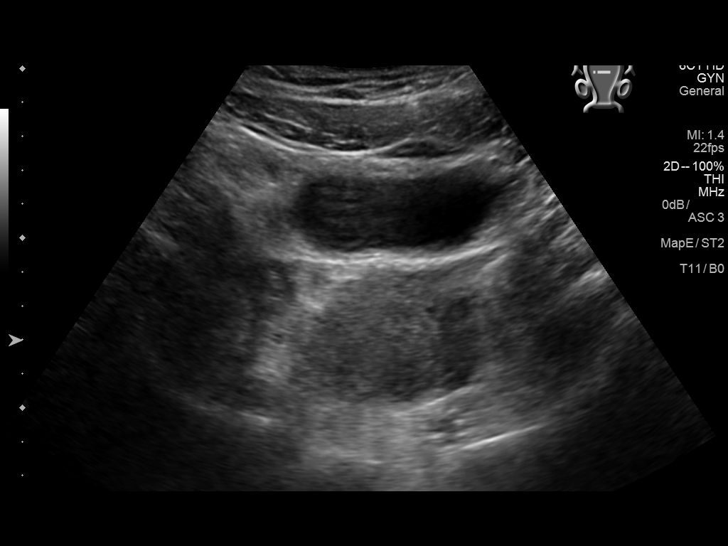
[im 22/65]
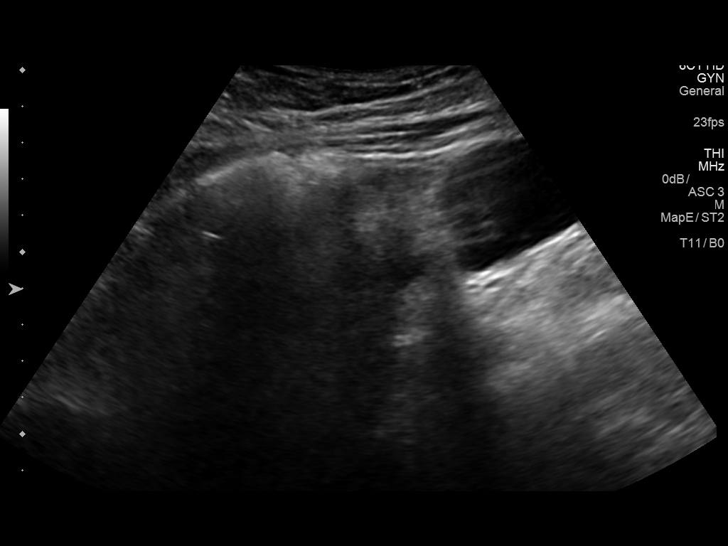
[im 27/65]
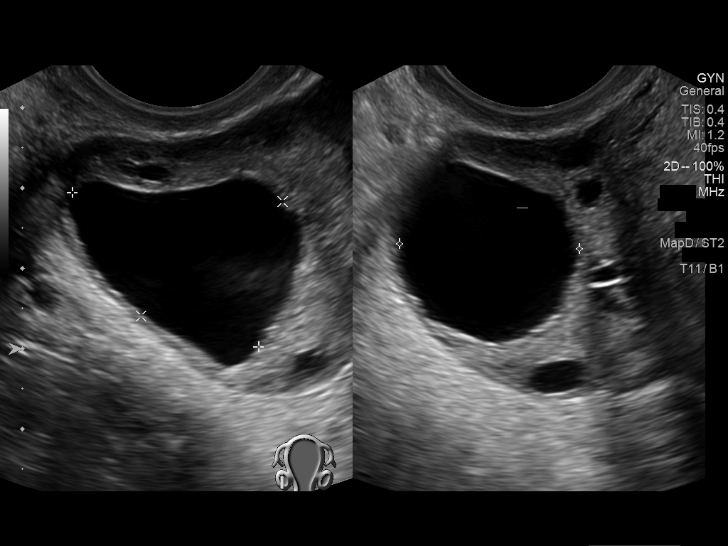
[im 33/65]
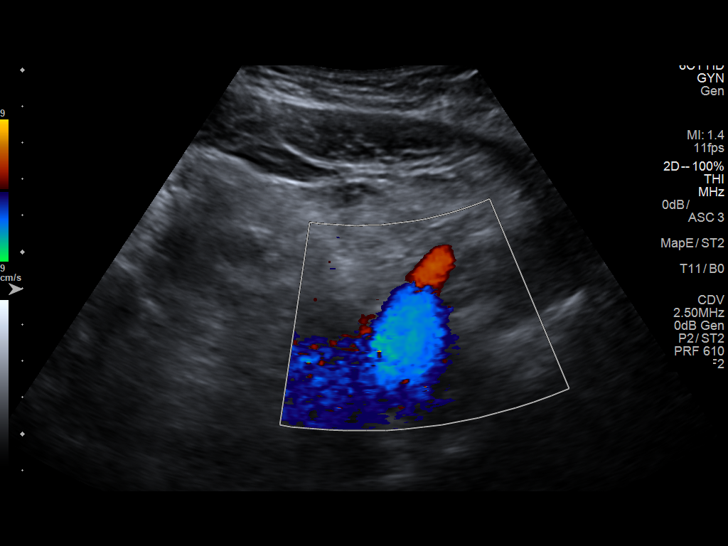
[im 38/65]
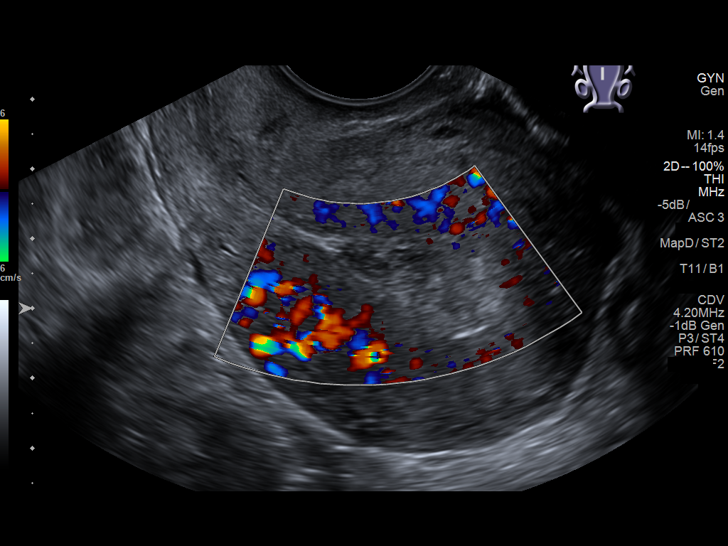
[im 43/65]
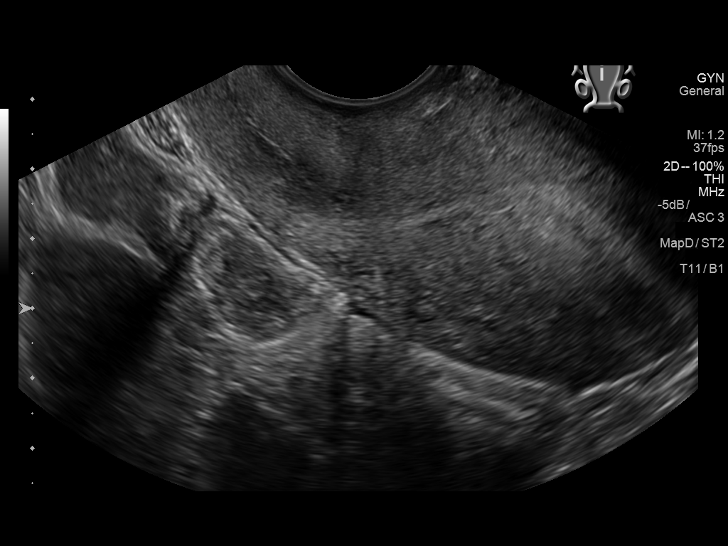
[im 49/65]
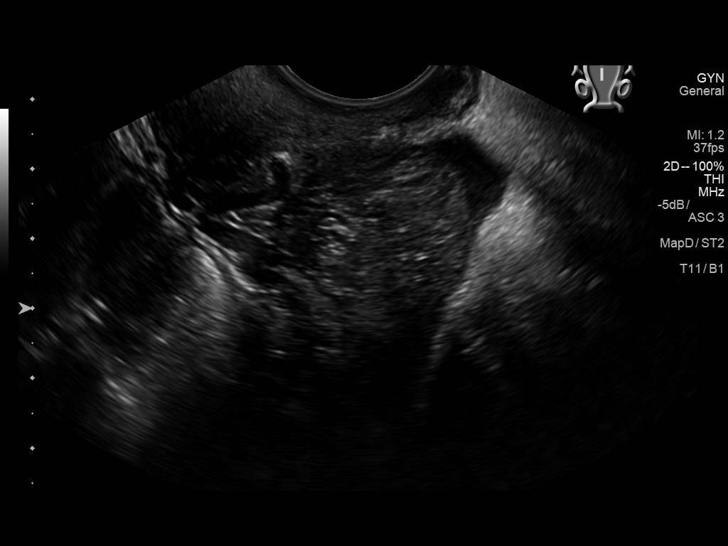
[im 54/65]
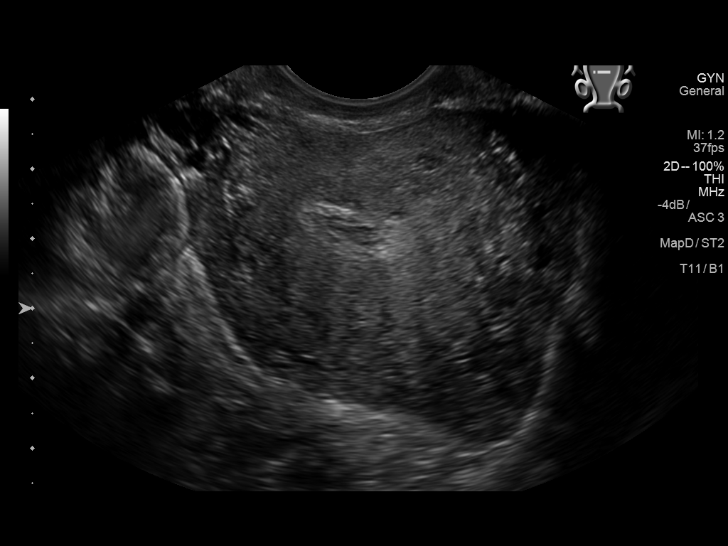
[im 59/65]
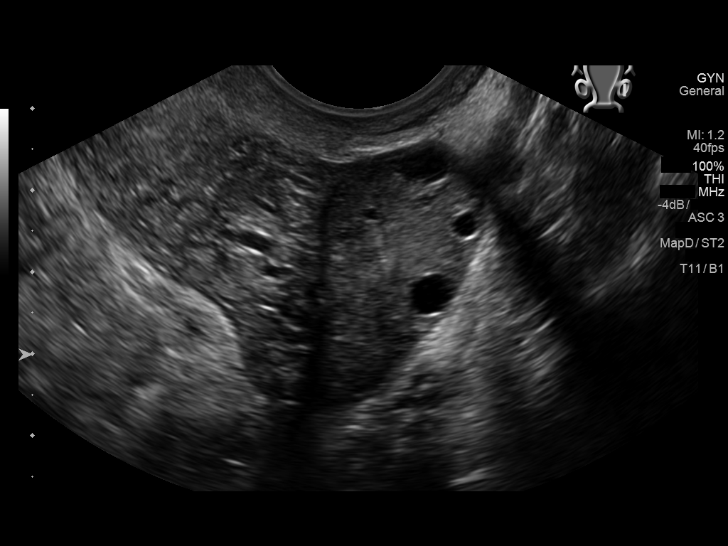
[im 65/65]
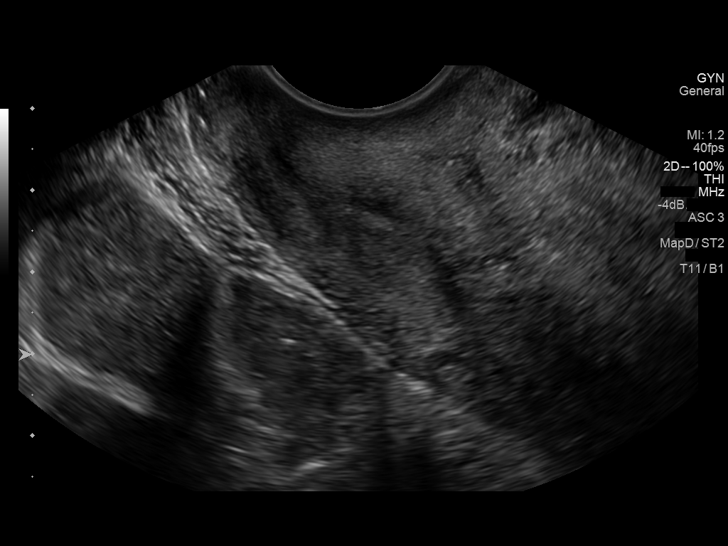

[13 of 25 positions shown; findings below may reference images not displayed]

FINDINGS: Uterus

Measurements: 6.8 x 4.2 x 5.2 cm. No fibroids or other mass
visualized. The uterus is retroverted in nature.

Endometrium

Thickness: 1.2 cm. A small amount of complex fluid is noted within
the endometrial echo complex.

Right ovary

Measurements: 4.2 x 3.3 x 3.0 cm. A 3.0 cm simple cyst is noted at
the right ovary.

Left ovary

Measurements: 3.5 x 1.8 x 2.0 cm. Normal appearance/no adnexal mass.

Other findings

Trace free fluid is noted at the right adnexa.
IMPRESSION: 1. Small amount of complex fluid within the endometrial echo
complex, reflecting blood. This likely corresponds to the patient's
menorrhagia.
2. Small right ovarian cyst is likely physiologic.

## 2017-10-30 ENCOUNTER — Other Ambulatory Visit: Payer: Self-pay

## 2017-10-30 ENCOUNTER — Encounter: Payer: Self-pay | Admitting: Emergency Medicine

## 2017-10-30 DIAGNOSIS — Z5321 Procedure and treatment not carried out due to patient leaving prior to being seen by health care provider: Secondary | ICD-10-CM | POA: Diagnosis not present

## 2017-10-30 DIAGNOSIS — R51 Headache: Secondary | ICD-10-CM | POA: Diagnosis not present

## 2017-10-30 NOTE — ED Triage Notes (Signed)
Pt arrives ambulatory to triage with c/o "my heartbeat throbbing in my head" x 2 months. Pt is NAD.

## 2017-10-31 ENCOUNTER — Emergency Department
Admission: EM | Admit: 2017-10-31 | Discharge: 2017-10-31 | Disposition: A | Discharge status: Home / Self Care | Payer: BLUE CROSS/BLUE SHIELD | Attending: Emergency Medicine | Admitting: Emergency Medicine

## 2017-11-02 ENCOUNTER — Telehealth: Payer: Self-pay | Admitting: Emergency Medicine

## 2017-11-02 NOTE — Telephone Encounter (Signed)
Called patient due to lwot to inquire about condition and follow up plans. Left message.   

## 2017-11-16 ENCOUNTER — Emergency Department
Admission: EM | Admit: 2017-11-16 | Discharge: 2017-11-16 | Disposition: A | Discharge status: Home / Self Care | Payer: BLUE CROSS/BLUE SHIELD | Attending: Emergency Medicine | Admitting: Emergency Medicine

## 2017-11-16 ENCOUNTER — Encounter: Payer: Self-pay | Admitting: Emergency Medicine

## 2017-11-16 DIAGNOSIS — H93A1 Pulsatile tinnitus, right ear: Secondary | ICD-10-CM | POA: Insufficient documentation

## 2017-11-16 DIAGNOSIS — Z79899 Other long term (current) drug therapy: Secondary | ICD-10-CM | POA: Insufficient documentation

## 2017-11-16 DIAGNOSIS — F17210 Nicotine dependence, cigarettes, uncomplicated: Secondary | ICD-10-CM | POA: Insufficient documentation

## 2017-11-16 DIAGNOSIS — J45909 Unspecified asthma, uncomplicated: Secondary | ICD-10-CM | POA: Insufficient documentation

## 2017-11-16 MED ORDER — IBUPROFEN 600 MG PO TABS
600.0000 mg | ORAL_TABLET | Freq: Once | ORAL | Status: AC
  Administered 2017-11-16: 600 mg via ORAL
  Filled 2017-11-16: qty 1

## 2017-11-16 MED ORDER — IBUPROFEN 600 MG PO TABS: 600.0000 mg | ORAL_TABLET | Freq: Three times a day (TID) | ORAL | 0 refills | Status: DC | PRN

## 2017-11-16 NOTE — Discharge Instructions (Signed)
Please make an appointment to follow-up with the ear nose and throat doctor this coming week for recheck.  Return to the emergency department sooner for any concerns whatsoever.  It was a pleasure to take care of you today, and thank you for coming to our emergency department.  If you have any questions or concerns before leaving please ask the nurse to grab me and I'm more than happy to go through your aftercare instructions again.  If you were prescribed any opioid pain medication today such as Norco, Vicodin, Percocet, morphine, hydrocodone, or oxycodone please make sure you do not drive when you are taking this medication as it can alter your ability to drive safely.  If you have any concerns once you are home that you are not improving or are in fact getting worse before you can make it to your follow-up appointment, please do not hesitate to call 911 and come back for further evaluation.  Merrily BrittleNeil Kiari Hosmer, MD

## 2017-11-16 NOTE — ED Notes (Addendum)
Pt c/o right ear pain for several weeks; checked in 10/30/17 but did not stay to be seen; pt says "my condition is worse"; pt in no acute distress

## 2017-11-16 NOTE — ED Triage Notes (Signed)
Pt states right ear "heartbeat" for months. Pt states "I can't sleep it gets so loud". Pt denies other symptoms.

## 2017-11-16 NOTE — ED Notes (Signed)
Patient reports "hearing" heart beat in right ear for over 1 month, reports seems to be getting worse.  No acute distress noted.

## 2017-11-16 NOTE — ED Notes (Signed)
Pt noted to be resting in WR with eyes closed, resp even and unlabored;

## 2017-11-16 NOTE — ED Provider Notes (Signed)
[facility] Emergency Department Provider Note  ____________________________________________   First MD Initiated Contact with Patient 11/16/17 [phone]     (approximate)  I have reviewed the triage vital signs and the nursing notes.   HISTORY  Chief Complaint Ear Problem    HPI Kathy Greene is a 26 y.o. female who self presents the emergency department with 6 months of constant pulsing tinnitus in her right ear.  She says the symptoms are moderate severity and are always there.  She has never seen a doctor until tonight.  The throbbing has made it difficult for her to sleep which brought her to the emergency department today.  She had no fevers or chills.  No trauma.  No nausea or vomiting.  No numbness or weakness.  Past Medical History:  Diagnosis Date  . Asthma   . Irregular periods     Patient Active Problem List   Diagnosis Date Noted  . Asthma 04/22/2015    History reviewed. No pertinent surgical history.  Prior to Admission medications   Medication Sig Start Date End Date Taking? Authorizing Provider  cephALEXin (KEFLEX) 500 MG capsule Take 1 capsule (500 mg total) by mouth 4 (four) times daily. 01/20/17   Tommi RumpsSummers, Rhonda L, PA-C  CRYSELLE-28 0.3-30 MG-MCG tablet Take 1 tablet by mouth daily. 02/11/16   [provider]  ibuprofen (ADVIL,MOTRIN) 600 MG tablet Take 1 tablet (600 mg total) by mouth every 8 (eight) hours as needed. 11/16/17   Merrily Brittleifenbark, Dejay Kronk, MD  naproxen (NAPROSYN) 500 MG tablet Take 500 mg by mouth 2 (two) times daily with a meal.    [provider]    Allergies Sulfa antibiotics  History reviewed. No pertinent family history.  Social History Social History   Tobacco Use  . Smoking status: Current Some Day Smoker    Packs/day: 0.50    Types: Cigarettes  . Smokeless tobacco: Never Used  Substance Use Topics  . Alcohol use: No  . Drug use: No    Review of Systems Constitutional: No  fever/chills ENT: No sore throat. Cardiovascular: Denies chest pain. Respiratory: Denies shortness of breath. Gastrointestinal: No abdominal pain.  No nausea, no vomiting.  No diarrhea.  No constipation. Musculoskeletal: Negative for back pain. Neurological: Negative for headaches   ____________________________________________   PHYSICAL EXAM:  VITAL SIGNS: ED Triage Vitals  Enc Vitals Group     BP 11/16/17 0151 121/67     Pulse Rate 11/16/17 0151 65     Resp 11/16/17 0151 15     Temp 11/16/17 0151 98 F (36.7 C)     Temp Source 11/16/17 0151 Oral     SpO2 11/16/17 0151 98 %     Weight 11/16/17 0151 215 lb (97.5 kg)     Height 11/16/17 0151 5\' 3"  (1.6 m)     Head Circumference --      Peak Flow --      Pain Score 11/16/17 0154 0     Pain Loc --      Pain Edu? --      Excl. in GC? --     Constitutional: Alert and oriented x4 well-appearing nontoxic no diaphoresis speaks full concerns Head: Atraumatic. Nose: No congestion/rhinnorhea. Mouth/Throat: No trismus Normal tympanic members bilaterally Neck: No stridor.   Cardiovascular: Regular rate and rhythm Respiratory: Normal respiratory effort.  No retractions. Neurologic:  Normal speech and language. No gross focal neurologic deficits are appreciated.  Skin:  Skin is warm, dry and intact.  No rash noted.    ____________________________________________  LABS (all labs ordered are listed, but only abnormal results are displayed)  Labs Reviewed - No data to display   __________________________________________  EKG   ____________________________________________  RADIOLOGY   ____________________________________________   DIFFERENTIAL includes but not limited to     PROCEDURES  Procedure(s) performed: no  Procedures  Critical Care performed: no  Observation: no ____________________________________________   INITIAL IMPRESSION / ASSESSMENT AND PLAN / ED COURSE  Pertinent labs & imaging  results that were available during my care of the patient were reviewed by me and considered in my medical decision making (see chart for details).  I recommended to the patient that she get a CT angiogram of her head and neck today to look for vascular etiology of her symptoms, however she declined stating she would prefer to just follow-up with otolaryngology this week which I think is actually reasonable given the duration of her symptoms.  She is discharged home in good condition verbalized understanding and agreement the plan.      ____________________________________________   FINAL CLINICAL IMPRESSION(S) / ED DIAGNOSES  Final diagnoses:  Pulsatile tinnitus of right ear      NEW MEDICATIONS STARTED DURING THIS VISIT:  This SmartLink is deprecated. Use AVSMEDLIST instead to display the medication list for a patient.   Note:  This document was prepared using Dragon voice recognition software and may include unintentional dictation errors.      Merrily Brittleifenbark, Colonel Krauser, MD 11/16/17 [phone]

## 2020-04-29 ENCOUNTER — Other Ambulatory Visit: Payer: Self-pay

## 2020-04-29 ENCOUNTER — Emergency Department
Admission: EM | Admit: 2020-04-29 | Discharge: 2020-04-29 | Disposition: A | Discharge status: Home / Self Care | Payer: Medicaid - Out of State | Attending: Emergency Medicine | Admitting: Emergency Medicine

## 2020-04-29 DIAGNOSIS — M545 Low back pain, unspecified: Secondary | ICD-10-CM

## 2020-04-29 DIAGNOSIS — N3090 Cystitis, unspecified without hematuria: Secondary | ICD-10-CM | POA: Diagnosis not present

## 2020-04-29 DIAGNOSIS — Z79899 Other long term (current) drug therapy: Secondary | ICD-10-CM | POA: Diagnosis not present

## 2020-04-29 DIAGNOSIS — J45909 Unspecified asthma, uncomplicated: Secondary | ICD-10-CM | POA: Insufficient documentation

## 2020-04-29 LAB — URINALYSIS, COMPLETE (UACMP) WITH MICROSCOPIC
Bilirubin Urine: NEGATIVE
Glucose, UA: NEGATIVE mg/dL
Hgb urine dipstick: NEGATIVE
Ketones, ur: NEGATIVE mg/dL
Nitrite: NEGATIVE
Protein, ur: NEGATIVE mg/dL
Specific Gravity, Urine: 1.017 (ref 1.005–1.030)
pH: 6 (ref 5.0–8.0)

## 2020-04-29 LAB — POCT PREGNANCY, URINE: Preg Test, Ur: NEGATIVE

## 2020-04-29 MED ORDER — CEPHALEXIN 500 MG PO CAPS: 500.0000 mg | ORAL_CAPSULE | Freq: Two times a day (BID) | ORAL | 0 refills | Status: AC

## 2020-04-29 MED ORDER — CYCLOBENZAPRINE HCL 5 MG PO TABS: ORAL_TABLET | ORAL | 0 refills | Status: AC

## 2020-04-29 MED ORDER — KETOROLAC TROMETHAMINE 30 MG/ML IJ SOLN
30.0000 mg | Freq: Once | INTRAMUSCULAR | Status: AC
  Administered 2020-04-29: 30 mg via INTRAMUSCULAR
  Filled 2020-04-29: qty 1

## 2020-04-29 MED ORDER — METHOCARBAMOL 500 MG PO TABS
500.0000 mg | ORAL_TABLET | Freq: Once | ORAL | Status: AC
  Administered 2020-04-29: 500 mg via ORAL
  Filled 2020-04-29: qty 1

## 2020-04-29 MED ORDER — IBUPROFEN 600 MG PO TABS: 600.0000 mg | ORAL_TABLET | Freq: Four times a day (QID) | ORAL | 0 refills | Status: AC | PRN

## 2020-04-29 NOTE — ED Triage Notes (Signed)
C/o low back pain since Thursday. States worked that morning. Denies falling/injury. A&O, ambulatory. States injured back in 2016 and was out of work x 2 weeks.

## 2020-04-29 NOTE — ED Provider Notes (Signed)
[facility] Emergency Department Provider Note  ____________________________________________  Time seen: Approximately 2:04 PM  I have reviewed the triage vital signs and the nursing notes.   HISTORY  Chief Complaint Back Pain    HPI Kathy Greene is a 29 y.o. female that presents to the emergency department for evaluation of low back pain for 2 days.  She states that her back feels swollen.  Patient states that she initially hurt her back 5 years ago.  She has an intermittent pain since.  No bowel or bladder dysfunction or saddle anesthesias.  No radicular pain.  No urinary symptoms.  Patient received her second Covid vaccination about 1 week ago.  Past Medical History:  Diagnosis Date  . Asthma   . Irregular periods     Patient Active Problem List   Diagnosis Date Noted  . Asthma 04/22/2015    History reviewed. No pertinent surgical history.  Prior to Admission medications   Medication Sig Start Date End Date Taking? Authorizing Provider  cephALEXin (KEFLEX) 500 MG capsule Take 1 capsule (500 mg total) by mouth 2 (two) times daily for 10 days. 04/29/20 05/09/20  Enid Derry, PA-C  CRYSELLE-28 0.3-30 MG-MCG tablet Take 1 tablet by mouth daily. 02/11/16   [provider]  cyclobenzaprine (FLEXERIL) 5 MG tablet Take 1-2 tablets 3 times daily as needed 04/29/20   Enid Derry, PA-C  ibuprofen (ADVIL) 600 MG tablet Take 1 tablet (600 mg total) by mouth every 6 (six) hours as needed. 04/29/20   Enid Derry, PA-C  naproxen (NAPROSYN) 500 MG tablet Take 500 mg by mouth 2 (two) times daily with a meal.    [provider]    Allergies Sulfa antibiotics  History reviewed. No pertinent family history.  Social History Social History   Tobacco Use  . Smoking status: Current Some Day Smoker    Packs/day: 0.50    Types: Cigarettes  . Smokeless tobacco: Never Used  Substance Use Topics  . Alcohol use: No  . Drug use: No      Review of Systems  Constitutional: No fever/chills ENT: No upper respiratory complaints. Cardiovascular: No chest pain. Respiratory: No cough. No SOB. Gastrointestinal: No abdominal pain.  No nausea, no vomiting.  Musculoskeletal: Positive for back pain. Skin: Negative for rash, abrasions, lacerations, ecchymosis. Neurological: Negative for headaches, numbness or tingling   ____________________________________________   PHYSICAL EXAM:  VITAL SIGNS: ED Triage Vitals  Enc Vitals Group     BP 04/29/20 1225 122/68     Pulse Rate 04/29/20 1225 68     Resp 04/29/20 1225 18     Temp 04/29/20 1225 98.8 F (37.1 C)     Temp Source 04/29/20 1225 Oral     SpO2 04/29/20 1225 98 %     Weight 04/29/20 1226 220 lb (99.8 kg)     Height 04/29/20 1226 5\' 3"  (1.6 m)     Head Circumference --      Peak Flow --      Pain Score 04/29/20 1226 8     Pain Loc --      Pain Edu? --      Excl. in GC? --      Constitutional: Alert and oriented. Well appearing and in no acute distress. Eyes: Conjunctivae are normal. PERRL. EOMI. Head: Atraumatic. ENT:      Ears:      Nose: No congestion/rhinnorhea.      Mouth/Throat: Mucous membranes are moist.  Neck: No stridor.  Cardiovascular: Normal rate, regular rhythm.  Good peripheral circulation. Respiratory: Normal respiratory effort without tachypnea or retractions. Lungs CTAB. Good air entry to the bases with no decreased or absent breath sounds. Gastrointestinal: Bowel sounds 4 quadrants. Soft and nontender to palpation. No guarding or rigidity. No palpable masses. No distention. No CVA tenderness. Musculoskeletal: Full range of motion to all extremities. No gross deformities appreciated.  Mild tenderness to palpation throughout lumbar spine and lumbar paraspinal muscles.  Strength equal in lower extremities bilaterally.  Normal gait. Neurologic:  Normal speech and language. No gross focal neurologic deficits are appreciated.  Skin:  Skin is  warm, dry and intact. No rash noted. Psychiatric: Mood and affect are normal. Speech and behavior are normal. Patient exhibits appropriate insight and judgement.   ____________________________________________   LABS (all labs ordered are listed, but only abnormal results are displayed)  Labs Reviewed  URINALYSIS, COMPLETE (UACMP) WITH MICROSCOPIC - Abnormal; Notable for the following components:      Result Value   Color, Urine YELLOW (*)    APPearance HAZY (*)    Leukocytes,Ua TRACE (*)    Bacteria, UA RARE (*)    All other components within normal limits  POC URINE PREG, ED  POCT PREGNANCY, URINE   ____________________________________________  EKG   ____________________________________________  RADIOLOGY   No results found.  ____________________________________________    PROCEDURES  Procedure(s) performed:    Procedures    Medications  ketorolac (TORADOL) 30 MG/ML injection 30 mg (30 mg Intramuscular Given 04/29/20 1512)  methocarbamol (ROBAXIN) tablet 500 mg (500 mg Oral Given 04/29/20 1511)     ____________________________________________   INITIAL IMPRESSION / ASSESSMENT AND PLAN / ED COURSE  Pertinent labs & imaging results that were available during my care of the patient were reviewed by me and considered in my medical decision making (see chart for details).  Review of the [place] CSRS was performed in accordance of the [place] prior to dispensing any controlled drugs.   Patient's diagnosis is consistent with muscle strain and cystitis.  Vital signs and exam are reassuring. Pregnancy test is negative.  Urinalysis demonstrates leukocytes and rare bacteria.  She will be given Keflex to cover for infection.  Patient will be discharged home with prescriptions for Keflex, Flexeril, Motrin. Patient is to follow up with primary care as directed. Patient is given ED precautions to return to the ED for any worsening or new symptoms.  Kathy Greene was evaluated  in Emergency Department on 04/29/2020 for the symptoms described in the history of present illness. She was evaluated in the context of the global COVID-19 pandemic, which necessitated consideration that the patient might be at risk for infection with the SARS-CoV-2 virus that causes COVID-19. Institutional protocols and algorithms that pertain to the evaluation of patients at risk for COVID-19 are in a state of rapid change based on information released by regulatory bodies including the CDC and federal and state organizations. These policies and algorithms were followed during the patient's care in the ED.   ____________________________________________  FINAL CLINICAL IMPRESSION(S) / ED DIAGNOSES  Final diagnoses:  Acute bilateral low back pain without sciatica  Cystitis      NEW MEDICATIONS STARTED DURING THIS VISIT:  ED Discharge Orders         Ordered    cephALEXin (KEFLEX) 500 MG capsule  2 times daily     04/29/20 1450    cyclobenzaprine (FLEXERIL) 5 MG tablet     04/29/20 1450    ibuprofen (ADVIL)  600 MG tablet  Every 6 hours PRN     04/29/20 1450              This chart was dictated using voice recognition software/Dragon. Despite best efforts to proofread, errors can occur which can change the meaning. Any change was purely unintentional.    Enid Derry, PA-C 04/29/20 1520    Phineas Semen, MD 04/29/20 [phone]

## 2021-06-05 ENCOUNTER — Encounter: Payer: Self-pay | Admitting: Physician Assistant

## 2021-06-05 ENCOUNTER — Ambulatory Visit: Payer: Self-pay | Admitting: Physician Assistant

## 2021-06-05 ENCOUNTER — Other Ambulatory Visit: Payer: Self-pay

## 2021-06-05 DIAGNOSIS — Z113 Encounter for screening for infections with a predominantly sexual mode of transmission: Secondary | ICD-10-CM

## 2021-06-05 LAB — WET PREP FOR TRICH, YEAST, CLUE
Trichomonas Exam: NEGATIVE
Yeast Exam: NEGATIVE

## 2021-06-05 NOTE — Progress Notes (Signed)
Pt here for STD screening.  Wet mount results reviewed, no treatment required.  Condoms given. Kathy Greene Kathy Duilio Heritage, RN ? ?

## 2021-06-05 NOTE — Progress Notes (Signed)
[facility] Department STI clinic/screening visit  Subjective:  Kathy Greene is a 30 y.o. female being seen today for an STI screening visit. The patient reports they do not have symptoms.  Patient reports that they do not desire a pregnancy in the next year.   They reported they are not interested in discussing contraception today.  Patient's last menstrual period was 05/10/2021.   Patient has the following medical conditions:   Patient Active Problem List   Diagnosis Date Noted   Asthma 04/22/2015    Chief Complaint  Patient presents with   SEXUALLY TRANSMITTED DISEASE    Screening    HPI  Patient reports that she is not having any symptoms but would like to have a screening today.  Denies surgeries and regular medicines.  States she has asthma and uses an inhaler prn.  Reports her last HIV test was about 2 years ago and last pap was 3-4 years ago. Patient is using condoms as her current BCM.  See flowsheet for further details and programmatic requirements.    The following portions of the patient's history were reviewed and updated as appropriate: allergies, current medications, past medical history, past social history, past surgical history and problem list.  Objective:  There were no vitals filed for this visit.  Physical Exam Constitutional:      General: She is not in acute distress.    Appearance: Normal appearance.  HENT:     Head: Normocephalic and atraumatic.     Comments: No nits,lice, or hair loss. No cervical, supraclavicular or axillary adenopathy.     Mouth/Throat:     Mouth: Mucous membranes are moist.     Pharynx: Oropharynx is clear. No oropharyngeal exudate or posterior oropharyngeal erythema.  Eyes:     Conjunctiva/sclera: Conjunctivae normal.  Pulmonary:     Effort: Pulmonary effort is normal.  Abdominal:     Palpations: Abdomen is soft. There is no mass.     Tenderness: There is no abdominal tenderness. There is no guarding or  rebound.  Genitourinary:    General: Normal vulva.     Rectum: Normal.     Comments: External genitalia/pubic area without nits, lice, edema, erythema, lesions and inguinal adenopathy. Vagina with normal mucosa and discharge. Cervix without visible lesions. Uterus firm, mobile, nt, no masses, no CMT, no adnexal tenderness or fullness.  Musculoskeletal:     Cervical back: Neck supple. No tenderness.  Skin:    General: Skin is warm and dry.     Findings: No bruising, erythema, lesion or rash.  Neurological:     Mental Status: She is alert and oriented to person, place, and time.  Psychiatric:        Mood and Affect: Mood normal.        Behavior: Behavior normal.        Thought Content: Thought content normal.        Judgment: Judgment normal.     Assessment and Plan:  Kathy Greene is a 30 y.o. female presenting to the [facility] Department for STI screening  1. Screening for STD (sexually transmitted disease) Patient into clinic without symptoms. Enc patient to see PCP or RTC through [facility] for annual exam and pap. Rec condoms with all sex. Await test results.  Counseled that RN will call if needs to RTC for treatment once results are back.  - WET PREP FOR TRICH, YEAST, CLUE - Chlamydia/Gonorrhea [place] [facility] - HIV [place] [facility] - Syphilis  Serology, [place] [facility] - Chlamydia/Gonorrhea [place] [facility]     No follow-ups on file.  No future appointments.  Kathy Holmes, PA

## 2021-06-07 LAB — HM HIV SCREENING LAB: HM HIV Screening: NEGATIVE

## 2023-03-31 ENCOUNTER — Emergency Department: Payer: BLUE CROSS/BLUE SHIELD

## 2023-03-31 ENCOUNTER — Emergency Department
Admission: EM | Admit: 2023-03-31 | Discharge: 2023-03-31 | Disposition: A | Discharge status: Home / Self Care | Payer: BLUE CROSS/BLUE SHIELD | Attending: Emergency Medicine | Admitting: Emergency Medicine

## 2023-03-31 ENCOUNTER — Other Ambulatory Visit: Payer: Self-pay

## 2023-03-31 DIAGNOSIS — B9689 Other specified bacterial agents as the cause of diseases classified elsewhere: Secondary | ICD-10-CM | POA: Insufficient documentation

## 2023-03-31 DIAGNOSIS — R109 Unspecified abdominal pain: Secondary | ICD-10-CM | POA: Diagnosis present

## 2023-03-31 DIAGNOSIS — N390 Urinary tract infection, site not specified: Secondary | ICD-10-CM | POA: Diagnosis not present

## 2023-03-31 LAB — BASIC METABOLIC PANEL
Anion gap: 7 (ref 5–15)
BUN: 13 mg/dL (ref 6–20)
CO2: 24 mmol/L (ref 22–32)
Calcium: 9.1 mg/dL (ref 8.9–10.3)
Chloride: 106 mmol/L (ref 98–111)
Creatinine, Ser: 0.77 mg/dL (ref 0.44–1.00)
GFR, Estimated: >60 mL/min (ref >60)
Glucose, Bld: 87 mg/dL (ref 70–99)
Potassium: 3.7 mmol/L (ref 3.5–5.1)
Sodium: 137 mmol/L (ref 135–145)

## 2023-03-31 LAB — URINALYSIS, ROUTINE W REFLEX MICROSCOPIC
Bilirubin Urine: NEGATIVE
Glucose, UA: NEGATIVE mg/dL
Hgb urine dipstick: NEGATIVE
Ketones, ur: NEGATIVE mg/dL
Nitrite: NEGATIVE
Protein, ur: NEGATIVE mg/dL
Specific Gravity, Urine: 1.014 (ref 1.005–1.030)
pH: 5 (ref 5.0–8.0)

## 2023-03-31 LAB — CBC
HCT: 36.6 % (ref 36.0–46.0)
Hemoglobin: 11.8 g/dL — ABNORMAL LOW (ref 12.0–15.0)
MCH: 31.5 pg (ref 26.0–34.0)
MCHC: 32.2 g/dL (ref 30.0–36.0)
MCV: 97.6 fL (ref 80.0–100.0)
Platelets: 225 10*3/uL (ref 150–400)
RBC: 3.75 MIL/uL — ABNORMAL LOW (ref 3.87–5.11)
RDW: 13.2 % (ref 11.5–15.5)
WBC: 6.6 10*3/uL (ref 4.0–10.5)
nRBC: 0 % (ref 0.0–0.2)

## 2023-03-31 LAB — POC URINE PREG, ED: Preg Test, Ur: NEGATIVE

## 2023-03-31 MED ORDER — NITROFURANTOIN MONOHYD MACRO 100 MG PO CAPS: 100.0000 mg | ORAL_CAPSULE | Freq: Two times a day (BID) | ORAL | 0 refills | Status: AC

## 2023-03-31 NOTE — ED Provider Notes (Signed)
[facility] Provider Note    Event Date/Time   First MD Initiated Contact with Patient 03/31/23 [phone]     (approximate)   History   Flank Pain   HPI  Kathy Greene is a 32 y.o. female  who presents to the emergency department today because of concern for intermittent hematuria and right flank pain. Symptoms have been present for roughly 1.5 weeks. The patient went to urgent care roughly 1 week ago and states that they tested her for many things and all were negative. The pain is sharp, however not particularly severe. She denies similar symptoms in the past.      Physical Exam   Triage Vital Signs: ED Triage Vitals  Enc Vitals Group     BP 03/31/23 0652 118/72     Pulse Rate 03/31/23 0652 62     Resp 03/31/23 0652 20     Temp 03/31/23 0652 98.3 F (36.8 C)     Temp Source 03/31/23 0652 Oral     SpO2 03/31/23 0652 99 %     Weight 03/31/23 0653 178 lb (80.7 kg)     Height 03/31/23 0653 5\' 3"  (1.6 m)     Head Circumference --      Peak Flow --      Pain Score 03/31/23 0704 5     Pain Loc --      Pain Edu? --      Excl. in GC? --     Most recent vital signs: Vitals:   03/31/23 0652  BP: 118/72  Pulse: 62  Resp: 20  Temp: 98.3 F (36.8 C)  SpO2: 99%   General: Awake, alert, oriented. CV:  Good peripheral perfusion. Regular rate and rhythm. Resp:  Normal effort. Lungs clear. Abd:  No distention. Mild tenderness to palpation of the right abdomen.   ED Results / Procedures / Treatments   Labs (all labs ordered are listed, but only abnormal results are displayed) Labs Reviewed  CBC - Abnormal; Notable for the following components:      Result Value   RBC 3.75 (*)    Hemoglobin 11.8 (*)    All other components within normal limits  BASIC METABOLIC PANEL  URINALYSIS, ROUTINE W REFLEX MICROSCOPIC  POC URINE PREG, ED     EKG  None   RADIOLOGY I independently interpreted and visualized the ct stone. My interpretation: No free  air, no large stone Radiology interpretation:  IMPRESSION:  1. Prominent stool throughout the colon favors constipation.  2. No urinary tract calculi are identified.      PROCEDURES:  Critical Care performed: No   MEDICATIONS ORDERED IN ED: Medications - No data to display   IMPRESSION / MDM / ASSESSMENT AND PLAN / ED COURSE  I reviewed the triage vital signs and the nursing notes.                              Differential diagnosis includes, but is not limited to, kidney stones, UTI  Patient's presentation is most consistent with acute presentation with potential threat to life or bodily function.  Patient presented to the emergency department today because of concerns for hematuria and right flank pain.  On exam patient with some tenderness to the right abdomen.  Blood work without concerning leukocytosis.  Urine did show findings concerning for urinary tract infection.  Given right flank pain and hematuria CT stone study was ordered.  This did not show a kidney stone.  At this time I do think patient's symptoms are likely related to infection.  Discussed this with the patient.  Will plan on discharging with antibiotics.  Also discussed finding of large amount of stool in the colon.    FINAL CLINICAL IMPRESSION(S) / ED DIAGNOSES   Final diagnoses:  Lower urinary tract infectious disease     Note:  This document was prepared using Dragon voice recognition software and may include unintentional dictation errors.    Phineas Semen, MD 03/31/23 [phone]

## 2023-03-31 NOTE — ED Triage Notes (Signed)
Pt comes with c/o blood in urine. Pt states week of this. Pt states she was tested for several different things and all neg. Pt states lower back and right sided pain.

## 2023-03-31 NOTE — Discharge Instructions (Signed)
Please seek medical attention for any high fevers, chest pain, shortness of breath, change in behavior, persistent vomiting, bloody stool or any other new or concerning symptoms.  

## 2023-05-13 ENCOUNTER — Ambulatory Visit (LOCAL_COMMUNITY_HEALTH_CENTER): Payer: BC Managed Care – PPO | Admitting: Family Medicine

## 2023-05-13 ENCOUNTER — Encounter: Payer: BLUE CROSS/BLUE SHIELD | Admitting: Family Medicine

## 2023-05-13 VITALS — BP 98/62 | Ht 63.0 in | Wt 180.6 lb

## 2023-05-13 DIAGNOSIS — Z308 Encounter for other contraceptive management: Secondary | ICD-10-CM | POA: Diagnosis not present

## 2023-05-13 DIAGNOSIS — Z3009 Encounter for other general counseling and advice on contraception: Secondary | ICD-10-CM

## 2023-05-13 MED ORDER — NORELGESTROMIN-ETH ESTRADIOL 150-35 MCG/24HR TD PTWK
1.0000 | MEDICATED_PATCH | TRANSDERMAL | 3 refills | Status: AC
Start: 2023-05-13 — End: ?

## 2023-05-13 NOTE — Progress Notes (Signed)
   [facility] Problem Visit  [facility]  Subjective:  ZELINA JIMERSON is a 32 y.o. being seen today for quick start of birth control  Chief Complaint  Patient presents with   Contraception    Birth control    HPI   Pt initially in clinic for STI visit, however she went to urgent care 04/30/23 and had a full workup. Presents today asking if she should have repeat testing done. Counseled that I would not recommend repeat testing in the setting of all negative previous testing, no new partners and consistent condom use. Pt then asked about birth control. Would like to try the patch.    Health Maintenance Due  Topic Date Due   COVID-19 Vaccine (1) Never done   Hepatitis C Screening  Never done   DTaP/Tdap/Td (1 - Tdap) Never done   PAP SMEAR-Modifier  Never done    ROS  The following portions of the patient's history were reviewed and updated as appropriate: allergies, current medications, past family history, past medical history, past social history, past surgical history and problem list. Problem list updated.   See flowsheet for other program required questions.  Objective:  There were no vitals filed for this visit.  Physical Exam  Deferred to next appointment   Assessment and Plan:  MARYAMA KURIAKOSE is a 32 y.o. female presenting to the [facility] Department for a Women's Health problem visit  1. Family planning Given Rx for 3 months of birth control patch. Counseled how to use. Please schedule appointment for physical in the next 3 months.  - norelgestromin-ethinyl estradiol [employer]) 150-35 MCG/24HR transdermal patch; Place 1 patch onto the skin once a week.  Dispense: 3 patch; Refill: 3     No follow-ups on file.  No future appointments. Total time spent 20 minutes  Lenice Llamas, [place]

## 2023-05-13 NOTE — Progress Notes (Signed)
Pt was scheduled for STI screening, however, pt was tested 2 weeks ago and changed her mind about additional testing.  Pt then stated that she wanted to start on a BCM.  Visit changed to an acute.   Provider sent an Rx for Xulane patch to pt's pharmacy.  Pt informed to make a PE appointment in the next 3 months.

## 2023-05-14 NOTE — Progress Notes (Signed)
Erroneous encounter-disregard

## 2023-10-27 ENCOUNTER — Ambulatory Visit: Payer: BC Managed Care – PPO | Admitting: Podiatry

## 2023-11-16 ENCOUNTER — Encounter: Payer: Self-pay | Admitting: Nurse Practitioner

## 2023-11-16 ENCOUNTER — Ambulatory Visit: Payer: BC Managed Care – PPO | Admitting: Nurse Practitioner

## 2023-11-16 DIAGNOSIS — Z720 Tobacco use: Secondary | ICD-10-CM

## 2023-11-16 DIAGNOSIS — Z113 Encounter for screening for infections with a predominantly sexual mode of transmission: Secondary | ICD-10-CM

## 2023-11-16 DIAGNOSIS — F419 Anxiety disorder, unspecified: Secondary | ICD-10-CM

## 2023-11-16 DIAGNOSIS — F32A Depression, unspecified: Secondary | ICD-10-CM

## 2023-11-16 LAB — HM HEPATITIS C SCREENING LAB: HM Hepatitis Screen: NEGATIVE

## 2023-11-16 LAB — WET PREP FOR TRICH, YEAST, CLUE
Trichomonas Exam: NEGATIVE
Yeast Exam: NEGATIVE

## 2023-11-16 LAB — HM HIV SCREENING LAB: HM HIV Screening: NEGATIVE

## 2023-11-16 NOTE — Progress Notes (Signed)
Pt is here for STD screening.  Wet mount results reviewed, no treatment required per SO.  Condoms given.  Aariya Ferrick M Timarion Agcaoili, RN  

## 2023-11-16 NOTE — Progress Notes (Cosign Needed)
[facility] Department  STI clinic/screening visit [address] [place] [address] [phone]  Subjective:  Kathy Greene is a 31 y.o. female being seen today for an STI screening visit. The patient reports they do not have symptoms.  Patient reports that they do not desire a pregnancy in the next year.   They reported they are not interested in discussing contraception today.    Patient's last menstrual period was 11/08/2023 (exact date).  Patient has the following medical conditions:   Patient Active Problem List   Diagnosis Date Noted   Asthma 04/22/2015    Chief Complaint  Patient presents with   SEXUALLY TRANSMITTED DISEASE    Screening    Patient is a pleasant 32 y.o. female who presents to clinic today requesting STI screening. She denies symptoms. Patient indicates 1 female partner, having vaginal sex and uses condoms sometimes. Patient reports a history of trich and or chlamydia, but she is unsure and it was a long time ago. Review of chart is non-revealing.  Patient reports LMP 11/08/2023. Last sex without a condom 11/14/23. Pt believes she has had a PAP before, but no results are located in the chart. She indicates having an appointment next month at PCP for PAP.  Patient reports anxiety and depression.   Does the patient using douching products? No  Last HIV test per patient/review of record was  Lab Results  Component Value Date   HMHIVSCREEN Negative - Validated 06/07/2021   No results found for: "HIV"   Last HEPC test per patient/review of record was No results found for: "HMHEPCSCREEN" No components found for: "HEPC"   Last HEPB test per patient/review of record was No components found for: "HMHEPBSCREEN" No components found for: "HEPC"   Patient reports last pap was No results found for: "DIAGPAP" No results found for: "SPECADGYN"  Screening for MPX risk: Does the patient have an unexplained rash? No Is the patient MSM? No Does  the patient endorse multiple sex partners or anonymous sex partners? No Did the patient have close or sexual contact with a person diagnosed with MPX? No Has the patient traveled outside the [place] where MPX is endemic? No Is there a high clinical suspicion for MPX-- evidenced by one of the following No  -Unlikely to be chickenpox  -Lymphadenopathy  -Rash that present in same phase of evolution on any given body part See flowsheet for further details and programmatic requirements.   Immunization history:   There is no immunization history on file for this patient.   The following portions of the patient's history were reviewed and updated as appropriate: allergies, current medications, past medical history, past social history, past surgical history and problem list.  Objective:  There were no vitals filed for this visit.  Physical Exam Nursing note reviewed.  Constitutional:      Appearance: Normal appearance.  HENT:     Head: Normocephalic.     Salivary Glands: Right salivary gland is not diffusely enlarged or tender. Left salivary gland is not diffusely enlarged or tender.     Mouth/Throat:     Lips: Pink. No lesions.     Mouth: Mucous membranes are moist.     Tongue: No lesions. Tongue does not deviate from midline.     Pharynx: Oropharynx is clear. Uvula midline. No oropharyngeal exudate or posterior oropharyngeal erythema.     Tonsils: No tonsillar exudate.  Eyes:     General:  Right eye: No discharge.        Left eye: No discharge.  Pulmonary:     Effort: Pulmonary effort is normal.  Genitourinary:    Comments: Patient asymptomatic. Self-swabbed. Lymphadenopathy:     Head:     Right side of head: No submental, submandibular, tonsillar, preauricular or posterior auricular adenopathy.     Left side of head: No submental, submandibular, tonsillar, preauricular or posterior auricular adenopathy.     Cervical: No cervical adenopathy.     Right cervical: No superficial  or posterior cervical adenopathy.    Left cervical: No superficial or posterior cervical adenopathy.     Upper Body:     Right upper body: No supraclavicular or axillary adenopathy.     Left upper body: No supraclavicular or axillary adenopathy.  Skin:    General: Skin is warm and dry.  Neurological:     Mental Status: She is alert and oriented to person, place, and time.  Psychiatric:        Attention and Perception: Attention normal.        Mood and Affect: Mood and affect normal.        Speech: Speech normal.        Behavior: Behavior normal. Behavior is cooperative.        Thought Content: Thought content normal.     Assessment and Plan:  [name] is a 32 y.o. female presenting to the [facility] Department for STI screening  1. Screening for venereal disease (Primary)  - Chlamydia/Gonorrhea [place] [facility] - HIV/HCV [place] [facility] - Syphilis Serology, [place] [facility] - WET PREP FOR TRICH, YEAST, CLUE  Patient accepted all screenings including vaginal CT/GC and bloodwork for HIV/RPR, and wet prep. Patient meets criteria for HepB screening? No. Ordered? not applicable Patient meets criteria for HepC screening? Yes. Ordered? yes  Treat wet prep per standing order Discussed time line for [facility] results and that patient will be called with positive results and encouraged patient to call if she had not heard in 2 weeks.  Counseled to return or seek care for continued or worsening symptoms Recommended repeat testing in 3 months with positive results. Recommended condom use with all sex  Patient is currently using  condoms sometimes  to prevent pregnancy. Discussed contraception with patient, but she was not interested in starting any method now.   2. Anxiety and depression Patient self-reports anxiety and depression for which she sees her PCP who is going to check her insurance and refer to a therapist. Informed patient of service available at health department for  counseling with Kathreen Cosier. Patient requested Amanda's card to make an appointment, this was provided.   3. Tobacco use Patient request and provided with quitline info for cessation assistance.   Return if symptoms worsen or fail to improve.  Future Appointments  Date Time Provider Department Center  12/15/2023  1:30 PM Candelaria Stagers, DPM [id number]   Total time with patient 20 minutes.   Edmonia James, NP

## 2023-12-15 ENCOUNTER — Ambulatory Visit: Payer: BC Managed Care – PPO | Admitting: Podiatry

## 2023-12-24 ENCOUNTER — Ambulatory Visit (LOCAL_COMMUNITY_HEALTH_CENTER): Payer: BC Managed Care – PPO

## 2023-12-24 ENCOUNTER — Ambulatory Visit: Payer: BC Managed Care – PPO

## 2023-12-24 DIAGNOSIS — Z23 Encounter for immunization: Secondary | ICD-10-CM

## 2023-12-24 DIAGNOSIS — Z719 Counseling, unspecified: Secondary | ICD-10-CM

## 2023-12-24 NOTE — Progress Notes (Signed)
In nurse clinic for immunzations. Patient requesting Flu and Tdap vaccine; patient wants to wait to receive Varicella vaccine. Voices no concerns. VIS reviewed and given to patient. Vaccine (Flu, Tdap) tolerated well; no issues noted. [facility] updated and copy given to patient.  Abagail Kitchens, RN

## 2023-12-29 ENCOUNTER — Ambulatory Visit (LOCAL_COMMUNITY_HEALTH_CENTER): Payer: BC Managed Care – PPO

## 2023-12-29 DIAGNOSIS — Z0184 Encounter for antibody response examination: Secondary | ICD-10-CM

## 2023-12-29 NOTE — Progress Notes (Signed)
In nurse clinic for varicella titer. ROI signed. Patient educated that [facility] will contact her when results are available. Questions answered and patient sent to lab for venipuncture.   Abagail Kitchens, RN

## 2023-12-30 LAB — VARICELLA ZOSTER ANTIBODY, IGG: Varicella zoster IgG: REACTIVE

## 2023-12-30 NOTE — Progress Notes (Signed)
Reviewed. Varicella titer results = Reactive, showing evidence of immunity. No Varicella vaccine is indicated. [facility] updated.  Phone call to patient, no answer and message left that results are ready and can be picked up at [facility] anytime from Monday through Friday between 8 am to 5 pm. Patient may call [facility] RN to explain results. Jerel Shepherd, RN

## 2024-01-08 ENCOUNTER — Other Ambulatory Visit: Payer: BC Managed Care – PPO

## 2024-01-11 ENCOUNTER — Ambulatory Visit (LOCAL_COMMUNITY_HEALTH_CENTER): Payer: BC Managed Care – PPO

## 2024-01-11 DIAGNOSIS — Z111 Encounter for screening for respiratory tuberculosis: Secondary | ICD-10-CM

## 2024-01-13 ENCOUNTER — Other Ambulatory Visit: Payer: Self-pay

## 2024-01-13 ENCOUNTER — Emergency Department
Admission: EM | Admit: 2024-01-13 | Discharge: 2024-01-13 | Disposition: A | Discharge status: Home / Self Care | Payer: Medicaid Other | Attending: Emergency Medicine | Admitting: Emergency Medicine

## 2024-01-13 DIAGNOSIS — J45909 Unspecified asthma, uncomplicated: Secondary | ICD-10-CM | POA: Diagnosis not present

## 2024-01-13 DIAGNOSIS — Z7951 Long term (current) use of inhaled steroids: Secondary | ICD-10-CM | POA: Insufficient documentation

## 2024-01-13 DIAGNOSIS — R21 Rash and other nonspecific skin eruption: Secondary | ICD-10-CM | POA: Diagnosis present

## 2024-01-13 DIAGNOSIS — T7840XA Allergy, unspecified, initial encounter: Secondary | ICD-10-CM | POA: Diagnosis not present

## 2024-01-13 MED ORDER — FAMOTIDINE 20 MG PO TABS
20.0000 mg | ORAL_TABLET | Freq: Once | ORAL | Status: AC
  Administered 2024-01-13: 20 mg via ORAL
  Filled 2024-01-13: qty 1

## 2024-01-13 MED ORDER — FAMOTIDINE 20 MG PO TABS: 20.0000 mg | ORAL_TABLET | Freq: Two times a day (BID) | ORAL | 0 refills | Status: DC

## 2024-01-13 MED ORDER — PREDNISONE 20 MG PO TABS: ORAL_TABLET | ORAL | 0 refills | Status: DC

## 2024-01-13 MED ORDER — PREDNISONE 20 MG PO TABS
60.0000 mg | ORAL_TABLET | Freq: Once | ORAL | Status: AC
  Administered 2024-01-13: 60 mg via ORAL
  Filled 2024-01-13: qty 3

## 2024-01-13 MED ORDER — FAMOTIDINE 20 MG PO TABS: 20.0000 mg | ORAL_TABLET | Freq: Two times a day (BID) | ORAL | 0 refills | Status: AC

## 2024-01-13 MED ORDER — PREDNISONE 20 MG PO TABS: ORAL_TABLET | ORAL | 0 refills | Status: AC

## 2024-01-13 NOTE — ED Provider Notes (Signed)
[facility] Provider Note    Event Date/Time   First MD Initiated Contact with Patient 01/13/24 [phone]     (approximate)   History   Allergic Reaction   HPI  Kathy Greene is a 33 y.o. female who presents to the ED from home with a chief complaint of allergic reaction.  Patient took Midol yesterday morning and felt palpitations afterwards.  Took it again at night and has been unable to sleep because she feels like her mouth is dry and she is having difficulty swallowing.  Reports occasional itching without rash.  Denies chest pain, shortness of breath, abdominal pain, nausea, vomiting or diarrhea.     Past Medical History   Past Medical History:  Diagnosis Date   Asthma    Irregular periods      Active Problem List   Patient Active Problem List   Diagnosis Date Noted   Asthma 04/22/2015     Past Surgical History  History reviewed. No pertinent surgical history.   Home Medications   Prior to Admission medications   Medication Sig Start Date End Date Taking? Authorizing Provider  [id number] 0.3-30 MG-MCG tablet Take 1 tablet by mouth daily. Patient not taking: Reported on 06/05/2021 02/11/16   [provider]  cyclobenzaprine (FLEXERIL) 5 MG tablet Take 1-2 tablets 3 times daily as needed Patient not taking: Reported on 06/05/2021 04/29/20   Enid Derry, PA-C  famotidine (PEPCID) 20 MG tablet Take 1 tablet (20 mg total) by mouth 2 (two) times daily. 01/13/24   Irean Hong, MD  ibuprofen (ADVIL) 600 MG tablet Take 1 tablet (600 mg total) by mouth every 6 (six) hours as needed. 04/29/20   Enid Derry, PA-C  naproxen (NAPROSYN) 500 MG tablet Take 500 mg by mouth 2 (two) times daily with a meal. Patient not taking: Reported on 06/05/2021    [provider]  norelgestromin-ethinyl estradiol [employer]) 150-35 MCG/24HR transdermal patch Place 1 patch onto the skin once a week. Patient not taking: Reported on 12/24/2023 05/13/23    Lenice Llamas, FNP  predniSONE (DELTASONE) 20 MG tablet 3 tablets daily x 4 days 01/13/24   Irean Hong, MD     Allergies  Sulfa antibiotics   Family History  History reviewed. No pertinent family history.   Physical Exam  Triage Vital Signs: ED Triage Vitals [01/13/24 0311]  Encounter Vitals Group     BP 120/72     Systolic BP Percentile      Diastolic BP Percentile      Pulse Rate 65     Resp 16     Temp 98.1 F (36.7 C)     Temp Source Oral     SpO2 100 %     Weight 186 lb (84.4 kg)     Height 5\' 3"  (1.6 m)     Head Circumference      Peak Flow      Pain Score 0     Pain Loc      Pain Education      Exclude from Growth Chart     Updated Vital Signs: BP 120/72   Pulse 65   Temp 98.1 F (36.7 C) (Oral)   Resp 16   Ht 5\' 3"  (1.6 m)   Wt 84.4 kg   LMP 12/10/2023   SpO2 100%   BMI 32.95 kg/m    General: Awake, no distress.  CV:  RRR.  Good peripheral perfusion.  Resp:  Normal effort.  CTAB. Abd:  No distention.  Other:  No rash.  Posterior oropharynx is clear.  Phonation normal.  There is no hoarse or muffled voice.  There is no drooling.  Tolerating secretions well.   ED Results / Procedures / Treatments  Labs (all labs ordered are listed, but only abnormal results are displayed) Labs Reviewed - No data to display   EKG  None   RADIOLOGY None   Official radiology report(s): No results found.   PROCEDURES:  Critical Care performed: No  Procedures   MEDICATIONS ORDERED IN ED: Medications  predniSONE (DELTASONE) tablet 60 mg (has no administration in time range)  famotidine (PEPCID) tablet 20 mg (has no administration in time range)     IMPRESSION / MDM / ASSESSMENT AND PLAN / ED COURSE  I reviewed the triage vital signs and the nursing notes.                             33 year old female presenting with drug reaction.  Will treat with prednisone and Pepcid.  Patient driving so will take Benadryl when she gets home.   Strict return precautions given.  Patient and family member verbalized understanding and agree with plan of care.  Patient's presentation is most consistent with acute, uncomplicated illness.    FINAL CLINICAL IMPRESSION(S) / ED DIAGNOSES   Final diagnoses:  Allergic reaction, initial encounter     Rx / DC Orders   ED Discharge Orders          Ordered    predniSONE (DELTASONE) 20 MG tablet  Status:  Discontinued        01/13/24 0344    famotidine (PEPCID) 20 MG tablet  2 times daily,   Status:  Discontinued        01/13/24 0344    famotidine (PEPCID) 20 MG tablet  2 times daily        01/13/24 0345    predniSONE (DELTASONE) 20 MG tablet        01/13/24 0345             Note:  This document was prepared using Dragon voice recognition software and may include unintentional dictation errors.   Irean Hong, MD 01/13/24 [phone]

## 2024-01-13 NOTE — Discharge Instructions (Signed)
1. Take the following medicines for the next 4 days: Prednisone 60mg  daily Pepcid 20mg  twice daily 2. Take Benadryl as needed for itching. 3.  Return to the ER for worsening symptoms, persistent vomiting, difficulty breathing or other concerns.

## 2024-01-13 NOTE — ED Triage Notes (Signed)
Pt reports a dry mouth after she took Midol earlier tonight.

## 2024-01-14 ENCOUNTER — Other Ambulatory Visit: Payer: BC Managed Care – PPO

## 2024-01-14 ENCOUNTER — Ambulatory Visit (LOCAL_COMMUNITY_HEALTH_CENTER): Payer: BC Managed Care – PPO

## 2024-01-14 DIAGNOSIS — Z111 Encounter for screening for respiratory tuberculosis: Secondary | ICD-10-CM

## 2024-01-14 LAB — TB SKIN TEST
Induration: 0 mm
TB Skin Test: NEGATIVE

## 2024-01-15 ENCOUNTER — Emergency Department: Payer: Medicaid Other

## 2024-01-15 ENCOUNTER — Other Ambulatory Visit: Payer: Self-pay

## 2024-01-15 ENCOUNTER — Emergency Department
Admission: EM | Admit: 2024-01-15 | Discharge: 2024-01-15 | Disposition: A | Discharge status: Home / Self Care | Payer: Medicaid Other | Attending: Emergency Medicine | Admitting: Emergency Medicine

## 2024-01-15 DIAGNOSIS — Z9189 Other specified personal risk factors, not elsewhere classified: Secondary | ICD-10-CM

## 2024-01-15 DIAGNOSIS — J45909 Unspecified asthma, uncomplicated: Secondary | ICD-10-CM | POA: Diagnosis not present

## 2024-01-15 DIAGNOSIS — R0600 Dyspnea, unspecified: Secondary | ICD-10-CM

## 2024-01-15 DIAGNOSIS — R079 Chest pain, unspecified: Secondary | ICD-10-CM | POA: Insufficient documentation

## 2024-01-15 DIAGNOSIS — R0602 Shortness of breath: Secondary | ICD-10-CM | POA: Diagnosis present

## 2024-01-15 LAB — BASIC METABOLIC PANEL
Anion gap: 7 (ref 5–15)
BUN: 16 mg/dL (ref 6–20)
CO2: 26 mmol/L (ref 22–32)
Calcium: 9.1 mg/dL (ref 8.9–10.3)
Chloride: 106 mmol/L (ref 98–111)
Creatinine, Ser: 0.81 mg/dL (ref 0.44–1.00)
GFR, Estimated: >60 mL/min (ref >60)
Glucose, Bld: 89 mg/dL (ref 70–99)
Potassium: 3.9 mmol/L (ref 3.5–5.1)
Sodium: 139 mmol/L (ref 135–145)

## 2024-01-15 LAB — RESP PANEL BY RT-PCR (RSV, FLU A&B, COVID)  RVPGX2
Influenza A by PCR: NEGATIVE
Influenza B by PCR: NEGATIVE
Resp Syncytial Virus by PCR: NEGATIVE
SARS Coronavirus 2 by RT PCR: NEGATIVE

## 2024-01-15 LAB — CBC WITH DIFFERENTIAL/PLATELET
Abs Immature Granulocytes: 0.02 10*3/uL (ref 0.00–0.07)
Basophils Absolute: 0 10*3/uL (ref 0.0–0.1)
Basophils Relative: 0 %
Eosinophils Absolute: 0 10*3/uL (ref 0.0–0.5)
Eosinophils Relative: 0 %
HCT: 35.3 % — ABNORMAL LOW (ref 36.0–46.0)
Hemoglobin: 11.3 g/dL — ABNORMAL LOW (ref 12.0–15.0)
Immature Granulocytes: 0 %
Lymphocytes Relative: 35 %
Lymphs Abs: 3.2 10*3/uL (ref 0.7–4.0)
MCH: 30.8 pg (ref 26.0–34.0)
MCHC: 32 g/dL (ref 30.0–36.0)
MCV: 96.2 fL (ref 80.0–100.0)
Monocytes Absolute: 0.6 10*3/uL (ref 0.1–1.0)
Monocytes Relative: 6 %
Neutro Abs: 5.4 10*3/uL (ref 1.7–7.7)
Neutrophils Relative %: 59 %
Platelets: 233 10*3/uL (ref 150–400)
RBC: 3.67 MIL/uL — ABNORMAL LOW (ref 3.87–5.11)
RDW: 13.1 % (ref 11.5–15.5)
WBC: 9.2 10*3/uL (ref 4.0–10.5)
nRBC: 0 % (ref 0.0–0.2)

## 2024-01-15 LAB — GROUP A STREP BY PCR: Group A Strep by PCR: NOT DETECTED

## 2024-01-15 LAB — D-DIMER, QUANTITATIVE: D-Dimer, Quant: 0.48 ug{FEU}/mL (ref 0.00–0.50)

## 2024-01-15 LAB — TROPONIN I (HIGH SENSITIVITY): Troponin I (High Sensitivity): 3 ng/L (ref <18)

## 2024-01-15 MED ORDER — ALBUTEROL SULFATE HFA 108 (90 BASE) MCG/ACT IN AERS: 2.0000 | INHALATION_SPRAY | RESPIRATORY_TRACT | 1 refills | Status: AC | PRN

## 2024-01-15 NOTE — ED Triage Notes (Signed)
Pt presents via POV c/o sob and painful swallowing. Reports seen at ED x2 days ago.   Ambulatory to triage in no acute respiratory distress.

## 2024-01-15 NOTE — ED Notes (Signed)
See triage note Presents with some some with dry cough Unsure of fever  Afebrile on arrival   states she is having some pain to left upper chest

## 2024-01-15 NOTE — ED Provider Notes (Signed)
[facility] Provider Note    Event Date/Time   First MD Initiated Contact with Patient 01/15/24 [phone]     (approximate)   History   Sore Throat and Shortness of Breath   HPI  DENECE SHEARER is a 33 y.o. female  with history of anemia, asthma and as listed in EMR presents to the emergency department for evaluation of shortness of breath, mid-sternal to left side chest pain that has been intermittent for the past 2 days. Symptoms started after taking Midol. She was evaluated in the ER and prescribed Prednisone. She doesn't feel it is helping. Chest pain and shortness of breath started again this morning and hasn't gone away.       Physical Exam   Triage Vital Signs: ED Triage Vitals  Encounter Vitals Group     BP 01/15/24 0624 115/70     Systolic BP Percentile --      Diastolic BP Percentile --      Pulse Rate 01/15/24 0624 (!) 58     Resp 01/15/24 0624 16     Temp 01/15/24 0624 97.7 F (36.5 C)     Temp Source 01/15/24 0624 Oral     SpO2 01/15/24 0624 100 %     Weight --      Height --      Head Circumference --      Peak Flow --      Pain Score 01/15/24 0625 0     Pain Loc --      Pain Education --      Exclude from Growth Chart --     Most recent vital signs: Vitals:   01/15/24 0624  BP: 115/70  Pulse: (!) 58  Resp: 16  Temp: 97.7 F (36.5 C)  SpO2: 100%    General: Awake, no distress.  CV:  Good peripheral perfusion.  Resp:  Normal effort. Breath sounds clear to auscultation. Abd:  No distention.  Other:  Airway is patent. No lip or tongue swelling.   ED Results / Procedures / Treatments   Labs (all labs ordered are listed, but only abnormal results are displayed) Labs Reviewed  GROUP A STREP BY PCR  RESP PANEL BY RT-PCR (RSV, FLU A&B, COVID)  [id number]     EKG  Sinus rhythm with occasional PVCs, rate of 68.   RADIOLOGY  Image and radiology report reviewed and interpreted by me. Radiology report consistent with the  same.  Chest x-ray negative for acute cardiopulmonary abnormality.  PROCEDURES:  Critical Care performed: No  Procedures   MEDICATIONS ORDERED IN ED:  Medications - No data to display   IMPRESSION / MDM / ASSESSMENT AND PLAN / ED COURSE   I have reviewed the triage note.  Differential diagnosis includes, but is not limited to, asthma exacerbation, viral syndrome, cardiac event, symptomatic anemia.  Patient's presentation is most consistent with acute presentation with potential threat to life or bodily function.  33 year old female presents for evaluation of chest pain and shortness of breath. See HPI. She feels symptoms are related to taking Midol or possibly anemia. She is not currently taking her iron supplements.  She also feels this could be related to her asthma and reports that she is out of her inhaler.  Plan will be to get labs in addition to the EKG and respiratory panel already sent.  EKG is without concerns. Troponin and D-dimer are both negative.  CMP is normal.  CBC shows a stable  anemia at 11.3.  Respiratory panel is negative.  Group A strep is negative.  Patient will be discharged home with prescription for albuterol.  She was also encouraged to take the prednisone at the same time each day.  ER return precautions discussed.      FINAL CLINICAL IMPRESSION(S) / ED DIAGNOSES   Final diagnoses:  None     Rx / DC Orders   ED Discharge Orders     None        Note:  This document was prepared using Dragon voice recognition software and may include unintentional dictation errors.   Chinita Pester, FNP 01/15/24 2440    Jene Every, MD 01/15/24 [phone]

## 2024-01-19 ENCOUNTER — Emergency Department: Payer: Medicaid Other

## 2024-01-19 ENCOUNTER — Emergency Department
Admission: EM | Admit: 2024-01-19 | Discharge: 2024-01-19 | Disposition: A | Discharge status: Home / Self Care | Payer: Medicaid Other | Attending: Emergency Medicine | Admitting: Emergency Medicine

## 2024-01-19 ENCOUNTER — Other Ambulatory Visit: Payer: Self-pay

## 2024-01-19 ENCOUNTER — Encounter: Payer: Self-pay | Admitting: Emergency Medicine

## 2024-01-19 DIAGNOSIS — J45909 Unspecified asthma, uncomplicated: Secondary | ICD-10-CM | POA: Diagnosis not present

## 2024-01-19 DIAGNOSIS — R0600 Dyspnea, unspecified: Secondary | ICD-10-CM

## 2024-01-19 DIAGNOSIS — R0602 Shortness of breath: Secondary | ICD-10-CM | POA: Diagnosis present

## 2024-01-19 DIAGNOSIS — F419 Anxiety disorder, unspecified: Secondary | ICD-10-CM | POA: Insufficient documentation

## 2024-01-19 LAB — CBC
HCT: 35.8 % — ABNORMAL LOW (ref 36.0–46.0)
Hemoglobin: 11.7 g/dL — ABNORMAL LOW (ref 12.0–15.0)
MCH: 31.4 pg (ref 26.0–34.0)
MCHC: 32.7 g/dL (ref 30.0–36.0)
MCV: 96 fL (ref 80.0–100.0)
Platelets: 256 10*3/uL (ref 150–400)
RBC: 3.73 MIL/uL — ABNORMAL LOW (ref 3.87–5.11)
RDW: 13.1 % (ref 11.5–15.5)
WBC: 7.2 10*3/uL (ref 4.0–10.5)
nRBC: 0 % (ref 0.0–0.2)

## 2024-01-19 LAB — TROPONIN I (HIGH SENSITIVITY): Troponin I (High Sensitivity): 3 ng/L (ref <18)

## 2024-01-19 LAB — BASIC METABOLIC PANEL
Anion gap: 11 (ref 5–15)
BUN: 18 mg/dL (ref 6–20)
CO2: 23 mmol/L (ref 22–32)
Calcium: 9.1 mg/dL (ref 8.9–10.3)
Chloride: 105 mmol/L (ref 98–111)
Creatinine, Ser: 0.81 mg/dL (ref 0.44–1.00)
GFR, Estimated: >60 mL/min (ref >60)
Glucose, Bld: 93 mg/dL (ref 70–99)
Potassium: 3.7 mmol/L (ref 3.5–5.1)
Sodium: 139 mmol/L (ref 135–145)

## 2024-01-19 MED ORDER — HYDROXYZINE HCL 25 MG PO TABS: 25.0000 mg | ORAL_TABLET | Freq: Three times a day (TID) | ORAL | 0 refills | Status: AC | PRN

## 2024-01-19 NOTE — Discharge Instructions (Signed)
Please take your medication as needed for anxiety symptoms as prescribed.  Do not drink alcohol or drive while taking this medication.  Please follow-up with your doctor within the next several days for recheck/reevaluation.  Return to the emergency department for any symptom personally concerning to yourself.

## 2024-01-19 NOTE — ED Triage Notes (Signed)
Pt here with SOB x1 week. Pt came here recently and was given an inhaler, pt states she used it today and it did not help. Pt states she is having trouble getting air in. Pt states she was having cp when it first started. Pt endorses nausea intermittently.

## 2024-01-19 NOTE — ED Notes (Signed)
See triage note  Presents with some SOB  States she feels like she isnot getting a good breath Afebrile on arrival

## 2024-01-19 NOTE — ED Provider Notes (Signed)
[facility] Provider Note    Event Date/Time   First MD Initiated Contact with Patient 01/19/24 1258     (approximate)  History   Chief Complaint: Shortness of Breath  HPI  Kathy Greene is a 33 y.o. female with a past medical history of asthma, anxiety, presents to the emergency department for shortness of breath.  According to the patient she has been experiencing shortness of breath symptoms over the past 1 week or so.  Patient was seen here on 2/12 as well as 2/14 for the same.  She also states she went to an urgent care yesterday for the same.  Patient states over the past 1 week she has been feeling intermittently shortness of breath.  Patient states she was told this could be an asthma exacerbation she was prescribed albuterol as well as prednisone.  Patient has been taking the prednisone but feels like the symptoms have been getting worse where she will continue to have episodes of feeling short of breath and experiencing palpitations like her heart is racing.  Patient denies any chest pain.  Physical Exam   Triage Vital Signs: ED Triage Vitals  Encounter Vitals Group     BP 01/19/24 0941 112/66     Systolic BP Percentile --      Diastolic BP Percentile --      Pulse Rate 01/19/24 0941 65     Resp 01/19/24 0941 17     Temp 01/19/24 0941 98.4 F (36.9 C)     Temp Source 01/19/24 0941 Oral     SpO2 01/19/24 0941 100 %     Weight 01/19/24 0943 185 lb 13.6 oz (84.3 kg)     Height 01/19/24 0943 5\' 3"  (1.6 m)     Head Circumference --      Peak Flow --      Pain Score 01/19/24 0943 3     Pain Loc --      Pain Education --      Exclude from Growth Chart --     Most recent vital signs: Vitals:   01/19/24 0941  BP: 112/66  Pulse: 65  Resp: 17  Temp: 98.4 F (36.9 C)  SpO2: 100%    General: Awake, no distress.  CV:  Good peripheral perfusion.  Regular rate and rhythm  Resp:  Normal effort.  Equal breath sounds bilaterally.  No wheeze.   Clear lungs. Abd:  No distention.  Soft, nontender.  No rebound or guarding.  ED Results / Procedures / Treatments   EKG  EKG viewed and interpreted by myself shows a normal sinus rhythm at 64 bpm with a narrow QRS, normal axis, normal intervals, no concerning ST changes.  RADIOLOGY  I reviewed interpret the chest x-ray images.  No consolidation seen my evaluation. Radiology is read the chest x-ray is negative   MEDICATIONS ORDERED IN ED: Medications - No data to display   IMPRESSION / MDM / ASSESSMENT AND PLAN / ED COURSE  I reviewed the triage vital signs and the nursing notes.  Patient's presentation is most consistent with acute presentation with potential threat to life or bodily function.  Patient presents to the emergency department for continued/intermittent shortness of breath.  Overall the patient appears well, reassuring vital signs pulse rate of 65 respiratory rate is normal 100% room air saturation.  On exam patient has clear lung sounds without any wheezes auscultated.  Discussed with the patient that the steroids and albuterol are likely worsening  her symptoms as I believe her shortness of breath is more likely related to anxiety.  Patient states she was told it could be anxiety but she states with her history of asthma she thought it could be asthma induced.  Patient's workup is reassuring with a normal CBC, normal chemistry, negative troponin, chest x-ray is clear, EKG reassuring.  Given the patient's reassuring workup I highly suspect this is more anxiety induced.  I discussed with the patient to try hydroxyzine to see if this helps with her symptoms.  Patient is following up with a behavioral therapist for anxiety already scheduled for this coming Monday.  Will discharge patient with hydroxyzine have the patient follow-up with her doctor.  Patient agreeable to plan of care.  Provided my typical shortness of breath return precautions.  FINAL CLINICAL IMPRESSION(S) / ED  DIAGNOSES   Dyspnea Anxiety   Note:  This document was prepared using Dragon voice recognition software and may include unintentional dictation errors.   Minna Antis, MD 01/19/24 1331

## 2024-01-25 ENCOUNTER — Other Ambulatory Visit: Payer: BC Managed Care – PPO

## 2024-03-15 ENCOUNTER — Ambulatory Visit

## 2024-03-29 ENCOUNTER — Ambulatory Visit: Admitting: Nurse Practitioner

## 2024-03-29 DIAGNOSIS — Z113 Encounter for screening for infections with a predominantly sexual mode of transmission: Secondary | ICD-10-CM

## 2024-03-29 LAB — WET PREP FOR TRICH, YEAST, CLUE
Trichomonas Exam: NEGATIVE
Yeast Exam: NEGATIVE

## 2024-03-29 LAB — HM HEPATITIS C SCREENING LAB: HM Hepatitis Screen: NEGATIVE

## 2024-03-29 LAB — HM HIV SCREENING LAB: HM HIV Screening: NEGATIVE

## 2024-03-29 NOTE — Progress Notes (Signed)
 Pt is here for STD screening. Wet prep results reviewed with pt, no treatment required per provider. Condoms Given, patient given the opportunity to ask questions for any clarifications. Austine Lefort, RN.

## 2024-03-31 LAB — HBV ANTIGEN/ANTIBODY STATE LAB
Hep B Core Total Ab: NONREACTIVE
Hep B S Ab: NONREACTIVE
Hepatitis B Surface Antigen: NONREACTIVE

## 2024-04-03 ENCOUNTER — Encounter: Payer: Self-pay | Admitting: Nurse Practitioner

## 2024-04-03 NOTE — Progress Notes (Signed)
 [facility] Department STI clinic [address], [address] [place] [address] Main phone: [phone]  STI screening visit  Subjective:  Kathy Greene is a 33 y.o. female being seen today for an STI screening visit. The patient reports they do not have symptoms.  Patient reports that they do not desire a pregnancy in the next year.   They reported they are not interested in discussing contraception today.    Patient's last menstrual period was 03/20/2024 (exact date).  Patient has the following medical conditions:  Patient Active Problem List   Diagnosis Date Noted   Asthma 04/22/2015   Chief Complaint  Patient presents with   SEXUALLY TRANSMITTED DISEASE   Patient is a pleasant 33 y.o. female who presents to the office today requesting asymptomatic STI testing. Patient indicates 1 female partner in the last 2 months. She reports practicing vaginal and oral sex and uses condoms sometimes. Patient indicates STI history of chlamydia and trichomoniasis "years ago." Patient reports last sex was 2-3 months ago. She indicates no use of a contraception method.  Patient indicates LMP was 03/20/24 and has periods monthly.    Last HIV test per patient/review of record was  Lab Results  Component Value Date   HMHIVSCREEN Negative - Validated 11/16/2023   Last HEPC test per patient/review of record was  Lab Results  Component Value Date   HMHEPCSCREEN Negative-Validated 11/16/2023    Last HEPB test per patient/review of record was No components found for: "HMHEPBSCREEN"   Patient reports last pap was:  No results found for: "DIAGPAP", "HPVHIGH", "ADEQPAP" No results found for: "SPECADGYN" No Cervical Cancer Screening results to display.  Screening for MPX risk: Does the patient have an unexplained rash? No Is the patient MSM? No Does the patient endorse multiple sex partners or anonymous sex partners? No Did the patient have close or sexual contact with a  person diagnosed with MPX? No Has the patient traveled outside the [place]  where MPX is endemic? No Is there a high clinical suspicion for MPX-- evidenced by one of the following No  -Unlikely to be chickenpox  -Lymphadenopathy  -Rash that present in same phase of evolution on any given body part See flowsheet for further details and programmatic requirements.   Immunization history:  Immunization History  Administered Date(s) Administered   Influenza, Seasonal, Injecte, Preservative Fre 12/24/2023   PPD Test 01/11/2024   Tdap 12/24/2023     The following portions of the patient's history were reviewed and updated as appropriate: allergies, current medications, past medical history, past social history, past surgical history and problem list.  Objective:  There were no vitals filed for this visit.  Physical Exam Nursing note reviewed.  Constitutional:      Appearance: Normal appearance.  HENT:     Head: Normocephalic.     Salivary Glands: Right salivary gland is not diffusely enlarged or tender. Left salivary gland is not diffusely enlarged or tender.     Mouth/Throat:     Lips: Pink. No lesions.     Mouth: Mucous membranes are moist.     Tongue: No lesions. Tongue does not deviate from midline.     Pharynx: Oropharynx is clear. Uvula midline. No oropharyngeal exudate or posterior oropharyngeal erythema.     Tonsils: No tonsillar exudate.  Eyes:     General:        Right eye: No discharge.        Left eye: No discharge.  Pulmonary:  Effort: Pulmonary effort is normal.  Genitourinary:    Comments: Patient asymptomatic. Declines genital exam. Self-swabbing.  Lymphadenopathy:     Head:     Right side of head: No submental, submandibular, tonsillar, preauricular or posterior auricular adenopathy.     Left side of head: No submental, submandibular, tonsillar, preauricular or posterior auricular adenopathy.     Cervical: No cervical adenopathy.     Right cervical: No  superficial or posterior cervical adenopathy.    Left cervical: No superficial or posterior cervical adenopathy.     Upper Body:     Right upper body: No supraclavicular or axillary adenopathy.     Left upper body: No supraclavicular or axillary adenopathy.  Skin:    General: Skin is warm and dry.     Findings: No rash.     Comments: Skin tone appropriate for ethnicity. Assessed exposed areas only and back.   Neurological:     Mental Status: She is alert and oriented to person, place, and time.  Psychiatric:        Attention and Perception: Attention and perception normal.        Mood and Affect: Mood and affect normal.        Speech: Speech normal.        Behavior: Behavior normal. Behavior is cooperative.        Thought Content: Thought content normal.     Assessment and Plan:  Kathy Greene is a 33 y.o. female presenting to the [facility] Department for STI screening  1. Screening for venereal disease (Primary)  - Chlamydia/Gonorrhea [place] [facility] - Gonococcus culture - HBV Antigen/Antibody [facility] - HIV/HCV [place] [facility] - WET PREP FOR TRICH, YEAST, CLUE - Syphilis Serology, [place] [facility]   Patient accepted all screenings including oral, vaginal CT/GC and bloodwork for HIV/RPR, and wet prep. Patient meets criteria for HepB screening? Yes. Ordered? yes Patient meets criteria for HepC screening? Yes. Ordered? yes  Treat wet prep per standing order Discussed time line for State Lab results and that patient will be called with positive results and encouraged patient to call if she had not heard in 2 weeks.  Counseled to return or seek care for continued or worsening symptoms Recommended repeat testing in 3 months with positive results. Recommended condom use with all sex for STI prevention.   Patient is currently using  female condoms sometimes  to prevent pregnancy.    Return if symptoms worsen or fail to improve.  No future appointments.  Total time with  patient 20 minutes.   Merleen Stare, NP

## 2024-04-04 LAB — GONOCOCCUS CULTURE

## 2024-04-13 ENCOUNTER — Encounter: Payer: Self-pay | Admitting: Nurse Practitioner

## 2024-05-31 ENCOUNTER — Encounter: Payer: Self-pay | Admitting: Family Medicine

## 2024-05-31 ENCOUNTER — Ambulatory Visit

## 2024-05-31 DIAGNOSIS — Z113 Encounter for screening for infections with a predominantly sexual mode of transmission: Secondary | ICD-10-CM

## 2024-05-31 DIAGNOSIS — B379 Candidiasis, unspecified: Secondary | ICD-10-CM

## 2024-05-31 LAB — WET PREP FOR TRICH, YEAST, CLUE
Clue Cell Exam: POSITIVE — AB
Trichomonas Exam: NEGATIVE

## 2024-05-31 LAB — HM HIV SCREENING LAB: HM HIV Screening: NEGATIVE

## 2024-05-31 MED ORDER — CLOTRIMAZOLE 1 % VA CREA: 1.0000 | TOPICAL_CREAM | Freq: Every day | VAGINAL | Status: AC

## 2024-05-31 NOTE — Progress Notes (Signed)
 Pt is here for STD visit.Wet prep reviewed with patient. The patient was dispensed clotrimazole 1% vaginal cream once/day for 7 days. I provided counseling regarding the medication the side effects and when to call the clinic. Condoms declined. Patient given the opportunity to ask questions for any clarification. Wilkie Drought, RN.

## 2024-05-31 NOTE — Progress Notes (Signed)
 [facility] STI clinic [address], [address] [place] [address] Main phone: [phone]  STI screening visit  Subjective:  Kathy Greene is a 33 y.o. female being seen today for an STI screening visit. The patient reports they do not have symptoms.  Patient reports that they do not desire a pregnancy in the next year.   They reported they are not interested in discussing contraception today.    Patient's last menstrual period was 05/23/2024.  Patient has the following medical conditions:  Patient Active Problem List   Diagnosis Date Noted   Asthma 04/22/2015   Chief Complaint  Patient presents with   SEXUALLY TRANSMITTED DISEASE    Pt is here STD screening and has no symptoms    HPI Patient reports to clinic for STI Testing- asymptomatic  Does the patient using douching products? No  See flowsheet for further details and programmatic requirements Hyperlink available at the top of the signed note in blue.  Flow sheet content below:  Pregnancy Intention Screening Does the patient want to become pregnant in the next year?: No Does the patient's partner want to become pregnant in the next year?: No Would the patient like to discuss contraceptive options today?: No All Patients Anyone smoke around pt and/or pt's children?: Yes Anyone smoke inside pt's house?: No Anyone smoke inside car?: No Anyone smoke inside the workplace?: No Reason For STD Screen STD Screening: Is asymptomatic Have you ever had an STD?: Yes History of Antibiotic use in the past 2 weeks?: No STD Symptoms Denies all: Yes Risk Factors for Hep B Household, sexual, or needle sharing contact of a person infected with Hep B: No Sexual contact with a person who uses drugs not as prescribed?: No Currently or Ever used drugs not as prescribed: No HIV Positive: No PRep Patient: No Men who have sex with men: N/A Have Hepatitis C: No History of Incarceration:  No History of Homeslessness?: No Anal sex following anal drug use?: No Risk Factors for Hep C Currently using drugs not as prescribed: No Sexual partner(s) currently using drugs as not prescribed: No History of drug use: No HIV Positive: No People with a history of incarceration: No People born between the years of 91 and 35: N/A Abuse History Has patient ever been abused physically?: No Has patient ever been abused sexually?: No Does patient feel they have a problem with Anxiety?: Yes Does patient feel they have a problem with Depression?: No Referral to Behavioral Health: Declined   Screening for MPX risk: Does the patient have an unexplained rash? No Is the patient MSM? No Does the patient endorse multiple sex partners or anonymous sex partners? No Did the patient have close or sexual contact with a person diagnosed with MPX? No Has the patient traveled outside the [place]  where MPX is endemic? No Is there a high clinical suspicion for MPX-- evidenced by one of the following No  -Unlikely to be chickenpox  -Lymphadenopathy  -Rash that present in same phase of evolution on any given body part  Screenings: Last HIV test per patient/review of record was  Lab Results  Component Value Date   HMHIVSCREEN Negative - Validated 03/29/2024   No results found for: HIV   Last HEPC test per patient/review of record was  Lab Results  Component Value Date   HMHEPCSCREEN Negative-Validated 03/29/2024   No components found for: HEPC   Last HEPB test per patient/review of record was No components found for: HMHEPBSCREEN  Patient reports last pap was:   No results found for: SPECADGYN No Cervical Cancer Screening results to display.  Immunization history:  Immunization History  Administered Date(s) Administered   Influenza, Seasonal, Injecte, Preservative Fre 12/24/2023   PPD Test 01/11/2024   Tdap 12/24/2023    The following portions of the patient's history were  reviewed and updated as appropriate: allergies, current medications, past medical history, past social history, past surgical history and problem list.  Objective:  There were no vitals filed for this visit.  Physical Exam Vitals and nursing note reviewed.  Constitutional:      Appearance: Normal appearance.  HENT:     Head: Normocephalic.     Mouth/Throat:     Mouth: Mucous membranes are moist.   Cardiovascular:     Rate and Rhythm: Normal rate.  Pulmonary:     Effort: Pulmonary effort is normal.  Abdominal:     Palpations: Abdomen is soft.  Genitourinary:    Comments: Declined genital exam- no symptoms, self swabbed  Musculoskeletal:        General: Normal range of motion.  Lymphadenopathy:     Head:     Right side of head: No submandibular, preauricular or posterior auricular adenopathy.     Left side of head: No submandibular, preauricular or posterior auricular adenopathy.     Cervical: No cervical adenopathy.     Upper Body:     Right upper body: No supraclavicular or axillary adenopathy.     Left upper body: No supraclavicular or axillary adenopathy.   Skin:    General: Skin is warm and dry.   Neurological:     Mental Status: She is alert and oriented to person, place, and time.   Psychiatric:        Mood and Affect: Mood normal.      Assessment and Plan:  NELANI SCHMELZLE is a 33 y.o. female presenting to the [facility] Department for STI screening  1. Screening for venereal disease (Primary)  - Chlamydia/Gonorrhea [place] [facility] - HIV [place] [facility] - Syphilis Serology, [place] [facility] - WET PREP FOR TRICH, YEAST, CLUE   Patient accepted the following screenings: vaginal CT/GC swab, vaginal wet prep, HIV, and RPR Patient meets criteria for HepB screening? No. Ordered? not applicable Patient meets criteria for HepC screening? No. Ordered? not applicable  Treat wet prep per standing order Discussed time line for State Lab results and that  patient will be called with positive results and encouraged patient to call if she had not heard in 2 weeks.  Counseled to return or seek care for continued or worsening symptoms Recommended repeat testing in 3 months with positive results. Recommended condom use with all sex for STI prevention.   Patient is currently using nothing to prevent pregnancy.    Return if symptoms worsen or fail to improve, for STI screening.  No future appointments.  Verneta Bers, [place]

## 2024-05-31 NOTE — Addendum Note (Signed)
 Addended by: VEDA HUMMINGBIRD on: 05/31/2024 03:02 PM   Modules accepted: Orders

## 2024-05-31 NOTE — Addendum Note (Signed)
 Addended by: VEDA HUMMINGBIRD on: 05/31/2024 02:39 PM   Modules accepted: Orders

## 2024-06-04 LAB — GONOCOCCUS CULTURE

## 2024-06-16 ENCOUNTER — Encounter: Payer: Self-pay | Admitting: Family Medicine

## 2024-12-27 ENCOUNTER — Ambulatory Visit
